# Patient Record
Sex: Male | Born: 1995 | Race: Black or African American | Hispanic: No | Marital: Single | Smoking: Never smoker
Health system: Southern US, Community
[De-identification: ages and names within clinical notes are randomized; demographics above are authoritative.]

## PROBLEM LIST (undated history)

## (undated) DIAGNOSIS — J45909 Unspecified asthma, uncomplicated: Secondary | ICD-10-CM

## (undated) DIAGNOSIS — E669 Obesity, unspecified: Secondary | ICD-10-CM

---
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
## 2015-03-08 ENCOUNTER — Emergency Department
Admission: EM | Admit: 2015-03-08 | Discharge: 2015-03-09 | Disposition: A | Discharge status: Home / Self Care | Payer: Self-pay | Attending: Emergency Medicine | Admitting: Emergency Medicine

## 2015-03-08 ENCOUNTER — Emergency Department: Payer: Self-pay

## 2015-03-08 ENCOUNTER — Other Ambulatory Visit: Payer: Self-pay

## 2015-03-08 DIAGNOSIS — J40 Bronchitis, not specified as acute or chronic: Secondary | ICD-10-CM | POA: Insufficient documentation

## 2015-03-08 MED ORDER — ALBUTEROL SULFATE (2.5 MG/3ML) 0.083% IN NEBU
2.5000 mg | INHALATION_SOLUTION | Freq: Once | RESPIRATORY_TRACT | Status: AC
  Administered 2015-03-08: 2.5 mg via RESPIRATORY_TRACT

## 2015-03-08 MED ORDER — ALBUTEROL SULFATE (2.5 MG/3ML) 0.083% IN NEBU
INHALATION_SOLUTION | RESPIRATORY_TRACT | Status: AC
  Administered 2015-03-08: 2.5 mg via RESPIRATORY_TRACT
  Filled 2015-03-08: qty 3

## 2015-03-08 NOTE — ED Notes (Signed)
Pt presents to ER stating cough congestion, pt has slight audible wheezes noted, denies hx of asthma.

## 2015-03-08 NOTE — ED Notes (Signed)
Pt reports he feels improved after breathing txt.

## 2015-03-09 LAB — CBC
HEMATOCRIT: 41.8 % (ref 40.0–52.0)
Hemoglobin: 13.8 g/dL (ref 13.0–18.0)
MCH: 29.4 pg (ref 26.0–34.0)
MCHC: 32.9 g/dL (ref 32.0–36.0)
MCV: 89.3 fL (ref 80.0–100.0)
Platelets: 291 10*3/uL (ref 150–440)
RBC: 4.69 MIL/uL (ref 4.40–5.90)
RDW: 13.8 % (ref 11.5–14.5)
WBC: 13.2 10*3/uL — ABNORMAL HIGH (ref 3.8–10.6)

## 2015-03-09 LAB — BASIC METABOLIC PANEL
Anion gap: 7 (ref 5–15)
BUN: 9 mg/dL (ref 6–20)
CO2: 28 mmol/L (ref 22–32)
CREATININE: 0.86 mg/dL (ref 0.61–1.24)
Calcium: 9.2 mg/dL (ref 8.9–10.3)
Chloride: 105 mmol/L (ref 101–111)
GFR calc Af Amer: >60 mL/min (ref >60)
GFR calc non Af Amer: >60 mL/min (ref >60)
Glucose, Bld: 124 mg/dL — ABNORMAL HIGH (ref 65–99)
Potassium: 3.5 mmol/L (ref 3.5–5.1)
Sodium: 140 mmol/L (ref 135–145)

## 2015-03-09 LAB — TROPONIN I

## 2015-03-09 MED ORDER — PREDNISONE 20 MG PO TABS
60.0000 mg | ORAL_TABLET | Freq: Every day | ORAL | Status: AC
Start: 2015-03-09 — End: 2016-03-11

## 2015-03-09 MED ORDER — PREDNISONE 20 MG PO TABS
ORAL_TABLET | ORAL | Status: AC
  Filled 2015-03-09: qty 3

## 2015-03-09 MED ORDER — PREDNISONE 20 MG PO TABS
60.0000 mg | ORAL_TABLET | Freq: Once | ORAL | Status: AC
  Administered 2015-03-09: 60 mg via ORAL

## 2015-03-09 MED ORDER — AZITHROMYCIN 250 MG PO TABS: ORAL_TABLET | ORAL | Status: AC

## 2015-03-09 MED ORDER — IPRATROPIUM-ALBUTEROL 0.5-2.5 (3) MG/3ML IN SOLN
3.0000 mL | Freq: Once | RESPIRATORY_TRACT | Status: AC
  Administered 2015-03-09: 3 mL via RESPIRATORY_TRACT

## 2015-03-09 MED ORDER — IPRATROPIUM-ALBUTEROL 0.5-2.5 (3) MG/3ML IN SOLN
RESPIRATORY_TRACT | Status: AC
  Filled 2015-03-09: qty 3

## 2015-03-09 MED ORDER — ALBUTEROL SULFATE HFA 108 (90 BASE) MCG/ACT IN AERS: 2.0000 | INHALATION_SPRAY | Freq: Four times a day (QID) | RESPIRATORY_TRACT | Status: DC | PRN

## 2015-03-09 NOTE — ED Provider Notes (Signed)
[facility] Emergency Department Provider Note  ____________________________________________  Time seen: Approximately 0002 AM  I have reviewed the triage vital signs and the nursing notes.   HISTORY  Chief Complaint Wheezing    HPI Edward Jennings is a 19 y.o. male who comes in tonight with some chest pain and shortness of breath that started at approximately 1 AM yesterday. The patient reports that he feels that his chest is tight when he breathes in and out. The patient reports that he also has had a mild cough. He has not taken anything at home for the chest pain or the cough. He reports that the pain does not radiate but he has felt dizzy with coughing and has a mild headache. The patient denies any nausea or vomiting no abdominal pain. He has never had this happen in the past and has had no sick contacts. The patient also denies fevers. The patient is concerned because he feels as though he is wheezing became in for evaluation. The patient did receive an albuterol neb treatment prior to my initial evaluation and he reports that his chest tightness is improved.   History reviewed. No pertinent past medical history.  There are no active problems to display for this patient.   History reviewed. No pertinent past surgical history.  Current Outpatient Rx  Name  Route  Sig  Dispense  Refill  . albuterol (PROVENTIL HFA;VENTOLIN HFA) 108 (90 BASE) MCG/ACT inhaler   Inhalation   Inhale 2 puffs into the lungs every 6 (six) hours as needed for wheezing or shortness of breath.   1 Inhaler   0   . azithromycin (ZITHROMAX Z-PAK) 250 MG tablet      Take 2 tablets (500 mg) on  Day 1,  followed by 1 tablet (250 mg) once daily on Days 2 through 5.   6 each   0   . predniSONE (DELTASONE) 20 MG tablet   Oral   Take 3 tablets (60 mg total) by mouth daily.   12 tablet   0     Allergies Review of patient's allergies indicates no known allergies.  History  reviewed. No pertinent family history.  Social History History  Substance Use Topics  . Smoking status: Never Smoker   . Smokeless tobacco: Not on file  . Alcohol Use: No    Review of Systems Constitutional: No fever/chills Eyes: No visual changes. ENT: No sore throat. Cardiovascular:  chest pain. Respiratory:  shortness of breath. Gastrointestinal: No abdominal pain.  No nausea, no vomiting.   Genitourinary: Negative for dysuria. Musculoskeletal: Negative for back pain. Skin: Negative for rash. Neurological: Headache  10-point ROS otherwise negative.  ____________________________________________   PHYSICAL EXAM:  VITAL SIGNS: ED Triage Vitals  Enc Vitals Group     BP 03/08/15 2216 152/79 mmHg     Pulse Rate 03/08/15 2216 97     Resp 03/08/15 2216 20     Temp 03/08/15 2216 98.2 F (36.8 C)     Temp Source 03/08/15 2216 Oral     SpO2 03/08/15 2216 94 %     Weight 03/08/15 2216 285 lb (129.275 kg)     Height 03/08/15 2216 5\' 9"  (1.753 m)     Head Cir --      Peak Flow --      Pain Score 03/08/15 2217 7     Pain Loc --      Pain Edu? --      Excl. in GC? --  Constitutional: Alert and oriented. Well appearing and in no acute distress. Eyes: Conjunctivae are normal. PERRL. EOMI. Head: Atraumatic. Nose: No congestion/rhinnorhea. Mouth/Throat: Mucous membranes are moist.  Oropharynx non-erythematous. Cardiovascular: Normal rate, regular rhythm. Grossly normal heart sounds.  Good peripheral circulation. Respiratory: Normal respiratory effort. Mild scattered expiratory wheezes throughout all lung fields Gastrointestinal: Soft and nontender. No distention. Positive bowel sounds. Genitourinary: Deferred Musculoskeletal: No lower extremity tenderness nor edema.   Neurologic:  Normal speech and language. No gross focal neurologic deficits are appreciated. Speech is normal. No gait instability. Skin:  Skin is warm, dry and intact. No rash noted. Psychiatric: Mood and  affect are normal. Speech and behavior are normal.  ____________________________________________   LABS (all labs ordered are listed, but only abnormal results are displayed)  Labs Reviewed  CBC - Abnormal; Notable for the following:    WBC 13.2 (*)    All other components within normal limits  BASIC METABOLIC PANEL - Abnormal; Notable for the following:    Glucose, Bld 124 (*)    All other components within normal limits  TROPONIN I   ____________________________________________  EKG  ED ECG REPORT   Date: 03/09/2015  EKG Time: 2229  Rate: 94  Rhythm: normal EKG, normal sinus rhythm  Axis: Normal  Intervals:none  ST&T Change: None  ____________________________________________  RADIOLOGY  Chest x-ray: No active cardiopulmonary disease ____________________________________________   PROCEDURES  Procedure(s) performed: None  Critical Care performed: No  ____________________________________________   INITIAL IMPRESSION / ASSESSMENT AND PLAN / ED COURSE  Pertinent labs & imaging results that were available during my care of the patient were reviewed by me and considered in my medical decision making (see chart for details).  The patient is 19 year old male who comes in with some shortness of breath cough chest pain and wheezing since yesterday. The patient reports that he is concerned he may have bronchitis and with this wheezing and no history of asthma it is a possibility. I will do one set of blood work to evaluate the patient's heart enzymes as well as his white blood cell count. I will give the patient a DuoNeb and a dose of prednisone and reassess the patient once I have received the results of the blood work.  The patient's blood work is unremarkable aside from a mildly elevated white blood cell count I will discharge the patient home with some prednisone and azithromycin and an inhaler for his  bronchitis. ____________________________________________   FINAL CLINICAL IMPRESSION(S) / ED DIAGNOSES  Final diagnoses:  Bronchitis      Rebecka ApleyAllison P Khamille Beynon, MD 03/09/15 [id number]

## 2015-03-09 NOTE — Discharge Instructions (Signed)
Upper Respiratory Infection, Adult An upper respiratory infection (URI) is also sometimes known as the common cold. The upper respiratory tract includes the nose, sinuses, throat, trachea, and bronchi. Bronchi are the airways leading to the lungs. Most people improve within 1 week, but symptoms can last up to 2 weeks. A residual cough may last even longer.  CAUSES Many different viruses can infect the tissues lining the upper respiratory tract. The tissues become irritated and inflamed and often become very moist. Mucus production is also common. A cold is contagious. You can easily spread the virus to others by oral contact. This includes kissing, sharing a glass, coughing, or sneezing. Touching your mouth or nose and then touching a surface, which is then touched by another person, can also spread the virus. SYMPTOMS  Symptoms typically develop 1 to 3 days after you come in contact with a cold virus. Symptoms vary from person to person. They may include:  Runny nose.  Sneezing.  Nasal congestion.  Sinus irritation.  Sore throat.  Loss of voice (laryngitis).  Cough.  Fatigue.  Muscle aches.  Loss of appetite.  Headache.  Low-grade fever. DIAGNOSIS  You might diagnose your own cold based on familiar symptoms, since most people get a cold 2 to 3 times a year. Your caregiver can confirm this based on your exam. Most importantly, your caregiver can check that your symptoms are not due to another disease such as strep throat, sinusitis, pneumonia, asthma, or epiglottitis. Blood tests, throat tests, and X-rays are not necessary to diagnose a common cold, but they may sometimes be helpful in excluding other more serious diseases. Your caregiver will decide if any further tests are required. RISKS AND COMPLICATIONS  You may be at risk for a more severe case of the common cold if you smoke cigarettes, have chronic heart disease (such as heart failure) or lung disease (such as asthma), or if  you have a weakened immune system. The very young and very old are also at risk for more serious infections. Bacterial sinusitis, middle ear infections, and bacterial pneumonia can complicate the common cold. The common cold can worsen asthma and chronic obstructive pulmonary disease (COPD). Sometimes, these complications can require emergency medical care and may be life-threatening. PREVENTION  The best way to protect against getting a cold is to practice good hygiene. Avoid oral or hand contact with people with cold symptoms. Wash your hands often if contact occurs. There is no clear evidence that vitamin C, vitamin E, echinacea, or exercise reduces the chance of developing a cold. However, it is always recommended to get plenty of rest and practice good nutrition. TREATMENT  Treatment is directed at relieving symptoms. There is no cure. Antibiotics are not effective, because the infection is caused by a virus, not by bacteria. Treatment may include:  Increased fluid intake. Sports drinks offer valuable electrolytes, sugars, and fluids.  Breathing heated mist or steam (vaporizer or shower).  Eating chicken soup or other clear broths, and maintaining good nutrition.  Getting plenty of rest.  Using gargles or lozenges for comfort.  Controlling fevers with ibuprofen or acetaminophen as directed by your caregiver.  Increasing usage of your inhaler if you have asthma. Zinc gel and zinc lozenges, taken in the first 24 hours of the common cold, can shorten the duration and lessen the severity of symptoms. Pain medicines may help with fever, muscle aches, and throat pain. A variety of non-prescription medicines are available to treat congestion and runny nose. Your caregiver   can make recommendations and may suggest nasal or lung inhalers for other symptoms.  HOME CARE INSTRUCTIONS   Only take over-the-counter or prescription medicines for pain, discomfort, or fever as directed by your  caregiver.  Use a warm mist humidifier or inhale steam from a shower to increase air moisture. This may keep secretions moist and make it easier to breathe.  Drink enough water and fluids to keep your urine clear or pale yellow.  Rest as needed.  Return to work when your temperature has returned to normal or as your caregiver advises. You may need to stay home longer to avoid infecting others. You can also use a face mask and careful hand washing to prevent spread of the virus. SEEK MEDICAL CARE IF:   After the first few days, you feel you are getting worse rather than better.  You need your caregiver's advice about medicines to control symptoms.  You develop chills, worsening shortness of breath, or brown or red sputum. These may be signs of pneumonia.  You develop yellow or brown nasal discharge or pain in the face, especially when you bend forward. These may be signs of sinusitis.  You develop a fever, swollen neck glands, pain with swallowing, or white areas in the back of your throat. These may be signs of strep throat. SEEK IMMEDIATE MEDICAL CARE IF:   You have a fever.  You develop severe or persistent headache, ear pain, sinus pain, or chest pain.  You develop wheezing, a prolonged cough, cough up blood, or have a change in your usual mucus (if you have chronic lung disease).  You develop sore muscles or a stiff neck. Document Released: 04/06/2001 Document Revised: 01/03/2012 Document Reviewed: 01/16/2014 Exit[employer] Patient Information 2015 [employer]. This information is not intended to replace advice given to you by your health care provider. Make sure you discuss any questions you have with your health care provider.  

## 2016-09-17 ENCOUNTER — Encounter: Payer: Self-pay | Admitting: Medical Oncology

## 2016-09-17 ENCOUNTER — Emergency Department
Admission: EM | Admit: 2016-09-17 | Discharge: 2016-09-17 | Disposition: A | Discharge status: Home / Self Care | Payer: Self-pay | Attending: Emergency Medicine | Admitting: Emergency Medicine

## 2016-09-17 DIAGNOSIS — Z79899 Other long term (current) drug therapy: Secondary | ICD-10-CM | POA: Insufficient documentation

## 2016-09-17 DIAGNOSIS — J4521 Mild intermittent asthma with (acute) exacerbation: Secondary | ICD-10-CM | POA: Insufficient documentation

## 2016-09-17 HISTORY — DX: Unspecified asthma, uncomplicated: J45.909

## 2016-09-17 MED ORDER — METHYLPREDNISOLONE 4 MG PO TBPK: ORAL_TABLET | ORAL | 0 refills | Status: DC

## 2016-09-17 MED ORDER — IPRATROPIUM-ALBUTEROL 0.5-2.5 (3) MG/3ML IN SOLN
3.0000 mL | Freq: Once | RESPIRATORY_TRACT | Status: AC
  Administered 2016-09-17: 3 mL via RESPIRATORY_TRACT
  Filled 2016-09-17: qty 3

## 2016-09-17 MED ORDER — ALBUTEROL SULFATE HFA 108 (90 BASE) MCG/ACT IN AERS: 2.0000 | INHALATION_SPRAY | Freq: Four times a day (QID) | RESPIRATORY_TRACT | 2 refills | Status: DC | PRN

## 2016-09-17 MED ORDER — DEXAMETHASONE SODIUM PHOSPHATE 4 MG/ML IJ SOLN
10.0000 mg | Freq: Once | INTRAMUSCULAR | Status: AC
  Administered 2016-09-17: 10 mg via INTRAMUSCULAR
  Filled 2016-09-17: qty 3

## 2016-09-17 NOTE — ED Triage Notes (Signed)
Pt reports hx of asthma, has been having sob x 3 days. Does not have inhaler at home.

## 2016-09-17 NOTE — ED Provider Notes (Signed)
[facility] Emergency Department Provider Note   ____________________________________________   First MD Initiated Contact with Patient 09/17/16 1052     (approximate)  I have reviewed the triage vital signs and the nursing notes.   HISTORY  Chief Complaint Asthma    HPI Edward Jennings is a 20 y.o. male patient complaining of shortness of breath for 3 days. Patient state has history of asthma and did not have her inhaler. Patient also states there is a nonproductive cough with this complaint. Patient rates his pain as a 7/10. No palliative measures taken for this complaint. Patient believe the change in weather history is asthma.   Past Medical History:  Diagnosis Date  . Asthma     There are no active problems to display for this patient.   History reviewed. No pertinent surgical history.  Prior to Admission medications   Medication Sig Start Date End Date Taking? Authorizing Provider  albuterol (PROVENTIL HFA;VENTOLIN HFA) 108 (90 BASE) MCG/ACT inhaler Inhale 2 puffs into the lungs every 6 (six) hours as needed for wheezing or shortness of breath. 03/09/15   Rebecka ApleyAllison P Webster, MD  albuterol (PROVENTIL HFA;VENTOLIN HFA) 108 (90 Base) MCG/ACT inhaler Inhale 2 puffs into the lungs every 6 (six) hours as needed for wheezing or shortness of breath. 09/17/16   Joni Reiningonald K Smith, PA-C  methylPREDNISolone (MEDROL DOSEPAK) 4 MG TBPK tablet Take Tapered dose as directed 09/17/16   Joni Reiningonald K Smith, PA-C    Allergies Patient has no known allergies.  No family history on file.  Social History Social History  Substance Use Topics  . Smoking status: Never Smoker  . Smokeless tobacco: Not on file  . Alcohol use No    Review of Systems Constitutional: No fever/chills Eyes: No visual changes. ENT: No sore throat. Cardiovascular: Denies chest pain. Respiratory: States mild shortness of breath and wheezing. Gastrointestinal: No abdominal pain.  No nausea, no  vomiting.  No diarrhea.  No constipation. Genitourinary: Negative for dysuria. Musculoskeletal: Negative for back pain. Skin: Negative for rash. Neurological: Negative for headaches, focal weakness or numbness.    ____________________________________________   PHYSICAL EXAM:  VITAL SIGNS: ED Triage Vitals  Enc Vitals Group     BP 09/17/16 1025 (!) 150/53     Pulse Rate 09/17/16 1025 (!) 101     Resp 09/17/16 1025 20     Temp 09/17/16 1025 98.2 F (36.8 C)     Temp Source 09/17/16 1025 Oral     SpO2 09/17/16 1025 96 %     Weight 09/17/16 1020 280 lb (127 kg)     Height 09/17/16 1020 5\' 9"  (1.753 m)     Head Circumference --      Peak Flow --      Pain Score 09/17/16 1020 7     Pain Loc --      Pain Edu? --      Excl. in GC? --     Constitutional: Alert and oriented. Well appearing and in no acute distress. Eyes: Conjunctivae are normal. PERRL. EOMI. Head: Atraumatic. Nose: No congestion/rhinnorhea. Mouth/Throat: Mucous membranes are moist.  Oropharynx non-erythematous. Neck: No stridor.  No cervical spine tenderness to palpation. Hematological/Lymphatic/Immunilogical: No cervical lymphadenopathy. Cardiovascular: Normal rate, regular rhythm. Grossly normal heart sounds.  Good peripheral circulation.Elevated systolic BP Respiratory: Normal respiratory effort.  No retractions. Lungs Bilateral expiratory wheezing. Gastrointestinal: Soft and nontender. No distention. No abdominal bruits. No CVA tenderness. Musculoskeletal: No lower extremity tenderness nor edema.  No  joint effusions. Neurologic:  Normal speech and language. No gross focal neurologic deficits are appreciated. No gait instability. Skin:  Skin is warm, dry and intact. No rash noted. Psychiatric: Mood and affect are normal. Speech and behavior are normal.  ____________________________________________   LABS (all labs ordered are listed, but only abnormal results are displayed)  Labs Reviewed - No data to  display ____________________________________________  EKG   ____________________________________________  RADIOLOGY   ____________________________________________   PROCEDURES  Procedure(s) performed: None  Procedures  Critical Care performed: No  ____________________________________________   INITIAL IMPRESSION / ASSESSMENT AND PLAN / ED COURSE  Pertinent labs & imaging results that were available during my care of the patient were reviewed by me and considered in my medical decision making (see chart for details).  Asthma exacerbation. Patient given discharge care instructions. Patient given a prescription for albuterol and Medrol Dosepak. Patient advised follow "clinic for continued care.  Clinical Course      ____________________________________________   FINAL CLINICAL IMPRESSION(S) / ED DIAGNOSES  Final diagnoses:  Mild intermittent asthma with exacerbation      NEW MEDICATIONS STARTED DURING THIS VISIT:  New Prescriptions   ALBUTEROL (PROVENTIL HFA;VENTOLIN HFA) 108 (90 BASE) MCG/ACT INHALER    Inhale 2 puffs into the lungs every 6 (six) hours as needed for wheezing or shortness of breath.   METHYLPREDNISOLONE (MEDROL DOSEPAK) 4 MG TBPK TABLET    Take Tapered dose as directed     Note:  This document was prepared using Dragon voice recognition software and may include unintentional dictation errors.    Joni ReiningRonald K Smith, PA-C 09/17/16 1101    Jennye MoccasinBrian S Quigley, MD 09/17/16 [phone]

## 2017-05-02 ENCOUNTER — Emergency Department
Admission: EM | Admit: 2017-05-02 | Discharge: 2017-05-02 | Disposition: A | Discharge status: Home / Self Care | Payer: Self-pay | Attending: Emergency Medicine | Admitting: Emergency Medicine

## 2017-05-02 ENCOUNTER — Encounter: Payer: Self-pay | Admitting: Emergency Medicine

## 2017-05-02 DIAGNOSIS — J4521 Mild intermittent asthma with (acute) exacerbation: Secondary | ICD-10-CM | POA: Insufficient documentation

## 2017-05-02 DIAGNOSIS — Z79899 Other long term (current) drug therapy: Secondary | ICD-10-CM | POA: Insufficient documentation

## 2017-05-02 MED ORDER — METHYLPREDNISOLONE SODIUM SUCC 125 MG IJ SOLR
125.0000 mg | Freq: Once | INTRAMUSCULAR | Status: AC
  Administered 2017-05-02: 125 mg via INTRAMUSCULAR
  Filled 2017-05-02: qty 2

## 2017-05-02 MED ORDER — METHYLPREDNISOLONE 4 MG PO TBPK: ORAL_TABLET | ORAL | 0 refills | Status: DC

## 2017-05-02 MED ORDER — IPRATROPIUM-ALBUTEROL 0.5-2.5 (3) MG/3ML IN SOLN
3.0000 mL | Freq: Once | RESPIRATORY_TRACT | Status: AC
  Administered 2017-05-02: 3 mL via RESPIRATORY_TRACT
  Filled 2017-05-02: qty 3

## 2017-05-02 MED ORDER — BENZONATATE 100 MG PO CAPS: 200.0000 mg | ORAL_CAPSULE | Freq: Three times a day (TID) | ORAL | 0 refills | Status: DC | PRN

## 2017-05-02 NOTE — ED Triage Notes (Signed)
Asthma exacerbation x 3 days.  Has been using albuterol inhaler, but symptoms are not resolving.  Symptoms worse at night.

## 2017-05-02 NOTE — ED Provider Notes (Signed)
[facility] Emergency Department Provider Note   ____________________________________________   First MD Initiated Contact with Patient 05/02/17 1413     (approximate)  I have reviewed the triage vital signs and the nursing notes.   HISTORY  Chief Complaint Wheezing; Cough; and Sore Throat    HPI Edward Jennings is a 21 y.o. male patient complaining of cough, wheezing, and sore throat for 3-4 days. Patient states that is worse at night when laying down. Patient stated minor relief with inhaler. Patient denies  fever associated this complaint. No other palliative measures for complaint.   Past Medical History:  Diagnosis Date  . Asthma     There are no active problems to display for this patient.   History reviewed. No pertinent surgical history.  Prior to Admission medications   Medication Sig Start Date End Date Taking? Authorizing Provider  albuterol (PROVENTIL HFA;VENTOLIN HFA) 108 (90 BASE) MCG/ACT inhaler Inhale 2 puffs into the lungs every 6 (six) hours as needed for wheezing or shortness of breath. 03/09/15   Rebecka Apley, MD  albuterol (PROVENTIL HFA;VENTOLIN HFA) 108 (90 Base) MCG/ACT inhaler Inhale 2 puffs into the lungs every 6 (six) hours as needed for wheezing or shortness of breath. 09/17/16   Joni Reining, PA-C  benzonatate (TESSALON PERLES) 100 MG capsule Take 2 capsules (200 mg total) by mouth 3 (three) times daily as needed for cough. 05/02/17 05/02/18  Joni Reining, PA-C  methylPREDNISolone (MEDROL DOSEPAK) 4 MG TBPK tablet Take Tapered dose as directed 09/17/16   Joni Reining, PA-C  methylPREDNISolone (MEDROL DOSEPAK) 4 MG TBPK tablet Take Tapered dose as directed 05/02/17   Joni Reining, PA-C    Allergies Patient has no known allergies.  No family history on file.  Social History Social History  Substance Use Topics  . Smoking status: Never Smoker  . Smokeless tobacco: Never Used  . Alcohol use No    Review of  Systems  Constitutional: No fever/chills Eyes: No visual changes. ENT: No sore throat. Cardiovascular: Denies chest pain. Respiratory:Wheezing and coughing.  Gastrointestinal: No abdominal pain.  No nausea, no vomiting.  No diarrhea.  No constipation. Genitourinary: Negative for dysuria. Musculoskeletal: Negative for back pain. Skin: Negative for rash. Neurological: Negative for headaches, focal weakness or numbness.  ____________________________________________   PHYSICAL EXAM:  VITAL SIGNS: ED Triage Vitals  Enc Vitals Group     BP 05/02/17 1402 (!) 139/92     Pulse Rate 05/02/17 1402 (!) 101     Resp 05/02/17 1402 18     Temp 05/02/17 1402 99.1 F (37.3 C)     Temp Source 05/02/17 1402 Oral     SpO2 05/02/17 1402 94 %     Weight 05/02/17 1400 280 lb (127 kg)     Height 05/02/17 1400 5\' 9"  (1.753 m)     Head Circumference --      Peak Flow --      Pain Score --      Pain Loc --      Pain Edu? --      Excl. in GC? --     Constitutional: Alert and oriented. Well appearing and in no acute distress. Eyes: Conjunctivae are normal. PERRL. EOMI. Head: Atraumatic. Nose: No congestion/rhinnorhea. Mouth/Throat: Mucous membranes are moist.  Oropharynx non-erythematous. Neck: No stridor.  Hematological/Lymphatic/Immunilogical: No cervical lymphadenopathy. Cardiovascular: Normal rate, regular rhythm. Grossly normal heart sounds.  Good peripheral circulation. Respiratory: Normal respiratory effort.  No retractions. Lungs  Diffuse wheezing . Neurologic:  Normal speech and language. No gross focal neurologic deficits are appreciated. No gait instability. Skin:  Skin is warm, dry and intact. No rash noted. Psychiatric: Mood and affect are normal. Speech and behavior are normal.  ____________________________________________   LABS (all labs ordered are listed, but only abnormal results are displayed)  Labs Reviewed - No data to  display ____________________________________________  EKG   ____________________________________________  RADIOLOGY  No results found.  ____________________________________________   PROCEDURES  Procedure(s) performed: None  Procedures  Critical Care performed: No  ____________________________________________   INITIAL IMPRESSION / ASSESSMENT AND PLAN / ED COURSE  Pertinent labs & imaging results that were available during my care of the patient were reviewed by me and considered in my medical decision making (see chart for details).  Asthma exacerbation. Patient wheezing decreased status post one nebulized treatment. Patient given discharge care instructions and advised [facility]. Patient given a work note for today.      ____________________________________________   FINAL CLINICAL IMPRESSION(S) / ED DIAGNOSES  Final diagnoses:  Mild intermittent asthma with exacerbation      NEW MEDICATIONS STARTED DURING THIS VISIT:  New Prescriptions   BENZONATATE (TESSALON PERLES) 100 MG CAPSULE    Take 2 capsules (200 mg total) by mouth 3 (three) times daily as needed for cough.   METHYLPREDNISOLONE (MEDROL DOSEPAK) 4 MG TBPK TABLET    Take Tapered dose as directed     Note:  This document was prepared using Dragon voice recognition software and may include unintentional dictation errors.    Joni ReiningSmith, Dailee Manalang K, PA-C 05/02/17 1504    Phineas SemenGoodman, [place], MD 05/02/17 [phone]

## 2017-05-02 NOTE — ED Notes (Signed)
See triage note  States he developed cough with some wheezing about 3 days ago used his inhaler with min results  Low grade fever on arival

## 2017-05-07 ENCOUNTER — Emergency Department
Admission: EM | Admit: 2017-05-07 | Discharge: 2017-05-07 | Disposition: A | Discharge status: Home / Self Care | Payer: Self-pay | Attending: Emergency Medicine | Admitting: Emergency Medicine

## 2017-05-07 ENCOUNTER — Emergency Department: Payer: Self-pay

## 2017-05-07 DIAGNOSIS — J4541 Moderate persistent asthma with (acute) exacerbation: Secondary | ICD-10-CM | POA: Insufficient documentation

## 2017-05-07 DIAGNOSIS — J45901 Unspecified asthma with (acute) exacerbation: Secondary | ICD-10-CM

## 2017-05-07 DIAGNOSIS — J45909 Unspecified asthma, uncomplicated: Secondary | ICD-10-CM | POA: Insufficient documentation

## 2017-05-07 HISTORY — DX: Obesity, unspecified: E66.9

## 2017-05-07 MED ORDER — IPRATROPIUM-ALBUTEROL 0.5-2.5 (3) MG/3ML IN SOLN
9.0000 mL | Freq: Once | RESPIRATORY_TRACT | Status: AC
  Administered 2017-05-07: 9 mL via RESPIRATORY_TRACT
  Filled 2017-05-07: qty 12

## 2017-05-07 MED ORDER — ALBUTEROL SULFATE (2.5 MG/3ML) 0.083% IN NEBU: 2.5000 mg | INHALATION_SOLUTION | Freq: Four times a day (QID) | RESPIRATORY_TRACT | 12 refills | Status: DC | PRN

## 2017-05-07 MED ORDER — ALBUTEROL SULFATE HFA 108 (90 BASE) MCG/ACT IN AERS: INHALATION_SPRAY | RESPIRATORY_TRACT | 1 refills | Status: DC

## 2017-05-07 MED ORDER — ALBUTEROL SULFATE (2.5 MG/3ML) 0.083% IN NEBU
5.0000 mg | INHALATION_SOLUTION | Freq: Once | RESPIRATORY_TRACT | Status: AC
  Administered 2017-05-07: 5 mg via RESPIRATORY_TRACT
  Filled 2017-05-07: qty 6

## 2017-05-07 MED ORDER — PREDNISONE 20 MG PO TABS
60.0000 mg | ORAL_TABLET | ORAL | Status: AC
  Administered 2017-05-07: 60 mg via ORAL
  Filled 2017-05-07: qty 3

## 2017-05-07 MED ORDER — PREDNISONE 10 MG PO TABS: ORAL_TABLET | ORAL | 0 refills | Status: DC

## 2017-05-07 NOTE — ED Provider Notes (Signed)
[facility] Emergency Department Provider Note  ____________________________________________   First MD Initiated Contact with Patient 05/07/17 [phone]     (approximate)  I have reviewed the triage vital signs and the nursing notes.   HISTORY  Chief Complaint Shortness of Breath    HPI Edward Jennings is a 21 y.o. male with a history of asthma which is developed over the last few years who presents for evaluation of gradually worsening shortness of breath and wheezing over the last week.  He states he thinks it is related to the weather.  He has a nebulizer machine at home but he has no albuterol for the machine and does not currently have an inhaler.  He has not been on steroids recently.  He denies fever/chills, chest pain, regular, vomiting, abdominal pain, dysuria.  He has had a dry cough.  Exertion makes his symptoms worse, as does hot weather, nothing particular makes it better.  He reports that he does not have any seasonal or environmental allergies.  He has some mild eczema on his neck.  He does have family members that have both eczema and asthma.  He does not have a primary care doctor.   Past Medical History:  Diagnosis Date  . Asthma   . Obesity     There are no active problems to display for this patient.   History reviewed. No pertinent surgical history.  Prior to Admission medications   Medication Sig Start Date End Date Taking? Authorizing Provider  benzonatate (TESSALON PERLES) 100 MG capsule Take 2 capsules (200 mg total) by mouth 3 (three) times daily as needed for cough. 05/02/17 05/02/18 Yes Joni Reining, PA-C  albuterol (PROVENTIL HFA;VENTOLIN HFA) 108 (90 Base) MCG/ACT inhaler Inhale 2-4 puffs by mouth every 4 hours as needed for wheezing, cough, and/or shortness of breath 05/07/17   Loleta Rose, MD  albuterol (PROVENTIL) (2.5 MG/3ML) 0.083% nebulizer solution Take 3 mLs (2.5 mg total) by nebulization every 6 (six) hours as needed for  wheezing or shortness of breath. 05/07/17   Loleta Rose, MD  predniSONE (DELTASONE) 10 MG tablet Take 6 tabs (60 mg) PO x 3 days, then take 4 tabs (40 mg) PO x 3 days, then take 2 tabs (20 mg) PO x 3 days, then take 1 tab (10 mg) PO x 3 days, then take 1/2 tab (5 mg) PO x 4 days. 05/07/17   Loleta Rose, MD    Allergies Patient has no known allergies.  History reviewed. No pertinent family history.  Social History Social History  Substance Use Topics  . Smoking status: Never Smoker  . Smokeless tobacco: Never Used  . Alcohol use No    Review of Systems Constitutional: No fever/chills Eyes: No visual changes. ENT: No sore throat. Cardiovascular: Denies chest pain. Respiratory: Gradually worsening shortness of breath and wheezing over the last week with dry cough Gastrointestinal: No abdominal pain.  No nausea, no vomiting.  No diarrhea.  No constipation. Musculoskeletal: Negative for neck pain.  Negative for back pain. Integumentary: Negative for rash. Neurological: Negative for headaches, focal weakness or numbness.   ____________________________________________   PHYSICAL EXAM:  VITAL SIGNS: ED Triage Vitals  Enc Vitals Group     BP 05/07/17 0453 (!) 135/106     Pulse Rate 05/07/17 0453 (!) 104     Resp 05/07/17 0453 20     Temp 05/07/17 0453 98.6 F (37 C)     Temp src --  SpO2 05/07/17 0453 98 %     Weight 05/07/17 0450 127 kg (280 lb)     Height 05/07/17 0450 1.753 m (5\' 9" )     Head Circumference --      Peak Flow --      Pain Score 05/07/17 0449 6     Pain Loc --      Pain Edu? --      Excl. in GC? --     Constitutional: Alert and oriented. Generally well appearing but in moderate respiratory distress Eyes: Conjunctivae are normal.  Head: Atraumatic. Nose: No congestion/rhinnorhea. Mouth/Throat: Mucous membranes are moist. Neck: No stridor.  No meningeal signs.   Cardiovascular: Mild tachycardia, regular rhythm. Good peripheral circulation.  Grossly normal heart sounds. Respiratory: Mildly increased respiratory effort.  No retractions. Moderate to severe expiratory wheezing throughout lung fields Gastrointestinal: Soft and nontender. No distention.  Musculoskeletal: No lower extremity tenderness nor edema. No gross deformities of extremities. Neurologic:  Normal speech and language. No gross focal neurologic deficits are appreciated.  Skin:  Skin is warm, dry and intact. No rash noted. Psychiatric: Mood and affect are normal. Speech and behavior are normal.  ____________________________________________   LABS (all labs ordered are listed, but only abnormal results are displayed)  Labs Reviewed - No data to display ____________________________________________  EKG  ED ECG REPORT I, Gayle Collard, the attending physician, personally viewed and interpreted this ECG.  Date: 05/07/2017 EKG Time: 4:52 AM Rate: 107 Rhythm: Sinus tachycardia QRS Axis: normal Intervals: normal ST/T Wave abnormalities: normal Narrative Interpretation: unremarkable  ____________________________________________  RADIOLOGY   Dg Chest 2 View  Result Date: 05/07/2017 CLINICAL DATA:  21 year old male with acute respiratory distress. History of asthma. EXAM: CHEST  2 VIEW COMPARISON:  Chest radiograph dated 03/08/2015 FINDINGS: The heart size and mediastinal contours are within normal limits. Both lungs are clear. The visualized skeletal structures are unremarkable. IMPRESSION: No active cardiopulmonary disease. Electronically Signed   By: Elgie CollardArash  Radparvar M.D.   On: 05/07/2017 05:05    ____________________________________________   PROCEDURES  Critical Care performed: No   Procedure(s) performed:   Procedures   ____________________________________________   INITIAL IMPRESSION / ASSESSMENT AND PLAN / ED COURSE  Pertinent labs & imaging results that were available during my care of the patient were reviewed by me and considered in  my medical decision making (see chart for details).  The patient is in moderate distress upon arrival but he is mentating well and has a normal SPO2.  I suspect that his asthma will improve dramatically after breathing treatments.  I will give him 60 of prednisone by mouth and 3 duo nebs and then reassess.  He agrees with the plan.  X-ray clear.   Clinical Course as of May 07 734  Sat May 07, 2017  [id number] Patient felt much better after DuoNeb labs and prednisone.  He still had some expiratory wheezing on auscultation but he had no more audible wheezing without the stethoscope and subjectively felt much improved.  I believe he is appropriate for discharge and outpatient follow-up.  I gave him the prescriptions listed below and provided two [employer] coupons to help with the cost.  encouraged establishing a PCP.    I gave my usual and customary return precautions.     [CF]    Clinical Course User Index [CF] Loleta RoseForbach, Mirabelle Cyphers, MD    ____________________________________________  FINAL CLINICAL IMPRESSION(S) / ED DIAGNOSES  Final diagnoses:  Moderate asthma with exacerbation, unspecified whether persistent  MEDICATIONS GIVEN DURING THIS VISIT:  Medications  albuterol (PROVENTIL) (2.5 MG/3ML) 0.083% nebulizer solution 5 mg (5 mg Nebulization Given 05/07/17 0503)  ipratropium-albuterol (DUONEB) 0.5-2.5 (3) MG/3ML nebulizer solution 9 mL (9 mLs Nebulization Given 05/07/17 0535)  predniSONE (DELTASONE) tablet 60 mg (60 mg Oral Given 05/07/17 0535)     NEW OUTPATIENT MEDICATIONS STARTED DURING THIS VISIT:  Discharge Medication List as of 05/07/2017  7:02 AM    START taking these medications   Details  albuterol (PROVENTIL) (2.5 MG/3ML) 0.083% nebulizer solution Take 3 mLs (2.5 mg total) by nebulization every 6 (six) hours as needed for wheezing or shortness of breath., Starting Sat 05/07/2017, Print    predniSONE (DELTASONE) 10 MG tablet Take 6 tabs (60 mg) PO x 3 days, then take 4 tabs (40  mg) PO x 3 days, then take 2 tabs (20 mg) PO x 3 days, then take 1 tab (10 mg) PO x 3 days, then take 1/2 tab (5 mg) PO x 4 days., Print        Discharge Medication List as of 05/07/2017  7:02 AM    CONTINUE these medications which have CHANGED   Details  albuterol (PROVENTIL HFA;VENTOLIN HFA) 108 (90 Base) MCG/ACT inhaler Inhale 2-4 puffs by mouth every 4 hours as needed for wheezing, cough, and/or shortness of breath, Print        Discharge Medication List as of 05/07/2017  7:02 AM       Note:  This document was prepared using Dragon voice recognition software and may include unintentional dictation errors.    Loleta Rose, MD 05/07/17 [phone]

## 2017-05-07 NOTE — Discharge Instructions (Signed)
We believe that your symptoms are caused today by an exacerbation of your asthma.  Please take the prescribed medications and any medications that you have at home.  Please call the number provided to establish a primary care doctor.  If you develop any new or worsening symptoms, including but not limited to fever, persistent vomiting, worsening shortness of breath, or other symptoms that concern you, please return to the Emergency Department immediately.

## 2017-06-07 ENCOUNTER — Encounter: Payer: Self-pay | Admitting: Emergency Medicine

## 2017-06-07 ENCOUNTER — Emergency Department
Admission: EM | Admit: 2017-06-07 | Discharge: 2017-06-07 | Disposition: A | Discharge status: Home / Self Care | Payer: Self-pay | Attending: Emergency Medicine | Admitting: Emergency Medicine

## 2017-06-07 DIAGNOSIS — J45901 Unspecified asthma with (acute) exacerbation: Secondary | ICD-10-CM | POA: Insufficient documentation

## 2017-06-07 LAB — GLUCOSE, CAPILLARY: Glucose-Capillary: 162 mg/dL — ABNORMAL HIGH (ref 65–99)

## 2017-06-07 MED ORDER — IPRATROPIUM-ALBUTEROL 0.5-2.5 (3) MG/3ML IN SOLN
RESPIRATORY_TRACT | Status: AC
Start: 2017-06-07 — End: 2017-06-07
  Administered 2017-06-07: 9 mL
  Filled 2017-06-07: qty 9

## 2017-06-07 MED ORDER — PREDNISONE 20 MG PO TABS: 60.0000 mg | ORAL_TABLET | Freq: Every day | ORAL | 0 refills | Status: AC

## 2017-06-07 MED ORDER — METHYLPREDNISOLONE SODIUM SUCC 125 MG IJ SOLR
125.0000 mg | Freq: Once | INTRAMUSCULAR | Status: AC
  Administered 2017-06-07: 125 mg via INTRAVENOUS
  Filled 2017-06-07: qty 2

## 2017-06-07 MED ORDER — ALBUTEROL SULFATE HFA 108 (90 BASE) MCG/ACT IN AERS: 2.0000 | INHALATION_SPRAY | Freq: Four times a day (QID) | RESPIRATORY_TRACT | 0 refills | Status: DC | PRN

## 2017-06-07 NOTE — ED Notes (Signed)

## 2017-06-07 NOTE — ED Provider Notes (Signed)
[facility] Emergency Department Provider Note    First MD Initiated Contact with Patient 06/07/17 0100     (approximate)  I have reviewed the triage vital signs and the nursing notes.   HISTORY  Chief Complaint Asthma    HPI Edward Jennings is a 21 y.o. male with history of asthma presents  to the emergency department with "asthma attack". Patient states symptoms of been occurring intermittently for the past 3 days unrelieved with home albuterol inhaler. Patient states this episode started at 10:00 PM last night and has progressively worsened. Patient denies any fever.   Past Medical History:  Diagnosis Date  . Asthma   . Obesity     There are no active problems to display for this patient.   History reviewed. No pertinent surgical history.  Prior to Admission medications   Medication Sig Start Date End Date Taking? Authorizing Provider  albuterol (PROVENTIL HFA;VENTOLIN HFA) 108 (90 Base) MCG/ACT inhaler Inhale 2-4 puffs by mouth every 4 hours as needed for wheezing, cough, and/or shortness of breath 05/07/17  Yes Loleta Rose, MD  albuterol (PROVENTIL) (2.5 MG/3ML) 0.083% nebulizer solution Take 3 mLs (2.5 mg total) by nebulization every 6 (six) hours as needed for wheezing or shortness of breath. 05/07/17  Yes Loleta Rose, MD  benzonatate (TESSALON PERLES) 100 MG capsule Take 2 capsules (200 mg total) by mouth 3 (three) times daily as needed for cough. Patient not taking: Reported on 06/07/2017 05/02/17 05/02/18  Joni Reining, PA-C  predniSONE (DELTASONE) 10 MG tablet Take 6 tabs (60 mg) PO x 3 days, then take 4 tabs (40 mg) PO x 3 days, then take 2 tabs (20 mg) PO x 3 days, then take 1 tab (10 mg) PO x 3 days, then take 1/2 tab (5 mg) PO x 4 days. Patient not taking: Reported on 06/07/2017 05/07/17   Loleta Rose, MD    Allergies No known drug allergies No family history on file.  Social History Social History  Substance Use Topics  . Smoking  status: Never Smoker  . Smokeless tobacco: Never Used  . Alcohol use Yes    Review of Systems Constitutional: No fever/chills Eyes: No visual changes. ENT: No sore throat. Cardiovascular: Denies chest pain. Respiratory:Positive for dyspnea and wheezing Gastrointestinal: No abdominal pain.  No nausea, no vomiting.  No diarrhea.  No constipation. Genitourinary: Negative for dysuria. Musculoskeletal: Negative for neck pain.  Negative for back pain. Integumentary: Negative for rash. Neurological: Negative for headaches, focal weakness or numbness.  ____________________________________________   PHYSICAL EXAM:  VITAL SIGNS: ED Triage Vitals  Enc Vitals Group     BP 06/07/17 0103 140/87     Pulse Rate 06/07/17 0103 99     Resp 06/07/17 0103 (!) 28     Temp 06/07/17 0103 98.4 F (36.9 C)     Temp Source 06/07/17 0103 Oral     SpO2 06/07/17 0103 92 %     Weight 06/07/17 0103 127 kg (280 lb)     Height 06/07/17 0103 1.727 m (5\' 8" )     Head Circumference --      Peak Flow --      Pain Score 06/07/17 0102 7     Pain Loc --      Pain Edu? --      Excl. in GC? --     Constitutional: Alert and oriented. Apparent respiratory distress Eyes: Conjunctivae are normal.  Head: Atraumatic.[name] Mouth/Throat: Mucous membranes are moist Neck:  No stridor.  Cardiovascular: Normal rate, regular rhythm. Good peripheral circulation. Grossly normal heart sounds. Respiratory: Tachypnea, diffuse extremity wheezing Gastrointestinal: Soft and nontender. No distention.  Musculoskeletal: No lower extremity tenderness nor edema. No gross deformities of extremities. Neurologic:  Normal speech and language. No gross focal neurologic deficits are appreciated.  Skin:  Skin is warm, dry and intact. No rash noted. Psychiatric: Mood and affect are normal. Speech and behavior are normal.  ____________________________________________   LABS (all labs ordered are listed, but only abnormal results are  displayed)  Labs Reviewed  GLUCOSE, CAPILLARY - Abnormal; Notable for the following:       Result Value   Glucose-Capillary 162 (*)    All other components within normal limits     PROCEDURES  Critical Care performed: CRITICAL CARE Performed by: Darci CurrentANDOLPH N BROWN   Total critical care time: 30 minutes  Critical care time was exclusive of separately billable procedures and treating other patients.  Critical care was necessary to treat or prevent imminent or life-threatening deterioration.  Critical care was time spent personally by me on the following activities: development of treatment plan with patient and/or surrogate as well as nursing, discussions with consultants, evaluation of patient's response to treatment, examination of patient, obtaining history from patient or surrogate, ordering and performing treatments and interventions, ordering and review of laboratory studies, ordering and review of radiographic studies, pulse oximetry and re-evaluation of patient's condition.    Procedures   ____________________________________________   INITIAL IMPRESSION / ASSESSMENT AND PLAN / ED COURSE  Pertinent labs & imaging results that were available during my care of the patient were reviewed by me and considered in my medical decision making (see chart for details).  21 year old male presenting with history of physical exam consistent with acute asthma exacerbation. Patient given DuoNeb 3 as well as Solu-Medrol 125 mg IV with resolution of symptoms. Patient will be prescribed prednisone for home as well as albuterol inhaler.      ____________________________________________  FINAL CLINICAL IMPRESSION(S) / ED DIAGNOSES  Final diagnoses:  Moderate asthma with exacerbation, unspecified whether persistent     MEDICATIONS GIVEN DURING THIS VISIT:  Medications  methylPREDNISolone sodium succinate (SOLU-MEDROL) 125 mg/2 mL injection 125 mg (125 mg Intravenous Given 06/07/17  0113)  ipratropium-albuterol (DUONEB) 0.5-2.5 (3) MG/3ML nebulizer solution (9 mLs  Given 06/07/17 0113)     NEW OUTPATIENT MEDICATIONS STARTED DURING THIS VISIT:  New Prescriptions   No medications on file    Modified Medications   No medications on file    Discontinued Medications   No medications on file     Note:  This document was prepared using Dragon voice recognition software and may include unintentional dictation errors.    Darci CurrentBrown, [place] N, MD 06/07/17 [phone]

## 2017-06-07 NOTE — ED Triage Notes (Signed)
Pt presents to ED with c/o asthma attack. Hx of the same. Pt states he ran out of his inhaler about 3 days ago. Started wheezing around 2200 last night has gotten progressively worse. Audible wheezing with increased work of breathing  noted at this time.

## 2017-06-29 ENCOUNTER — Encounter: Payer: Self-pay | Admitting: Emergency Medicine

## 2017-06-29 ENCOUNTER — Emergency Department
Admission: EM | Admit: 2017-06-29 | Discharge: 2017-06-29 | Disposition: A | Discharge status: Home / Self Care | Payer: Self-pay | Attending: Emergency Medicine | Admitting: Emergency Medicine

## 2017-06-29 DIAGNOSIS — J45901 Unspecified asthma with (acute) exacerbation: Secondary | ICD-10-CM | POA: Insufficient documentation

## 2017-06-29 MED ORDER — COMPRESSOR/NEBULIZER MISC: 1.0000 [IU] | Freq: Once | 0 refills | Status: AC

## 2017-06-29 MED ORDER — PREDNISONE 20 MG PO TABS: 60.0000 mg | ORAL_TABLET | Freq: Every day | ORAL | 0 refills | Status: AC

## 2017-06-29 MED ORDER — IPRATROPIUM-ALBUTEROL 0.5-2.5 (3) MG/3ML IN SOLN
6.0000 mL | Freq: Four times a day (QID) | RESPIRATORY_TRACT | Status: DC
  Administered 2017-06-29: 6 mL via RESPIRATORY_TRACT

## 2017-06-29 MED ORDER — ALBUTEROL SULFATE (2.5 MG/3ML) 0.083% IN NEBU
5.0000 mg | INHALATION_SOLUTION | Freq: Once | RESPIRATORY_TRACT | Status: AC
  Administered 2017-06-29: 5 mg via RESPIRATORY_TRACT

## 2017-06-29 MED ORDER — ALBUTEROL SULFATE (2.5 MG/3ML) 0.083% IN NEBU: 2.5000 mg | INHALATION_SOLUTION | RESPIRATORY_TRACT | 0 refills | Status: DC | PRN

## 2017-06-29 MED ORDER — METHYLPREDNISOLONE SODIUM SUCC 125 MG IJ SOLR
125.0000 mg | Freq: Once | INTRAMUSCULAR | Status: AC
  Administered 2017-06-29: 125 mg via INTRAVENOUS

## 2017-06-29 MED ORDER — METHYLPREDNISOLONE SODIUM SUCC 125 MG IJ SOLR
INTRAMUSCULAR | Status: AC
  Administered 2017-06-29: 125 mg via INTRAVENOUS
  Filled 2017-06-29: qty 2

## 2017-06-29 MED ORDER — ALBUTEROL SULFATE (2.5 MG/3ML) 0.083% IN NEBU
INHALATION_SOLUTION | RESPIRATORY_TRACT | Status: AC
  Administered 2017-06-29: 5 mg via RESPIRATORY_TRACT
  Filled 2017-06-29: qty 6

## 2017-06-29 NOTE — ED Triage Notes (Signed)
Patient to ER with c/o shortness of breath. Patient unable to speak upon arrival, has to hold hands above head to be able to breathe, and sats at 89% on RA. Patient with labored breathing with accessory muscle use. Patient given Albuterol in triage and moved to room 16 for triage afterwards.

## 2017-06-29 NOTE — ED Provider Notes (Signed)
[facility] Emergency Department Provider Note   First MD Initiated Contact with Patient 06/29/17 0025     (approximate)  I have reviewed the triage vital signs and the nursing notes.   HISTORY  Chief Complaint Shortness of Breath   HPI Edward Jennings is a 21 y.o. male with history of asthma presents to the emergency department with asthma attack times one day. Patient states progressive wheezing and shortness of breath. Patient denies any fever afebrile on presentation. Patient states that he does not have any asthma treatment at home at this time stating that his nebulizer machine broke. In addition the patient states he has a prescription for an albuterol inhaler but he thought he would be able to wait until tomorrow to fill it.   Past Medical History:  Diagnosis Date  . Asthma   . Obesity     There are no active problems to display for this patient.   History reviewed. No pertinent surgical history.  Prior to Admission medications   Medication Sig Start Date End Date Taking? Authorizing Provider  albuterol (PROVENTIL HFA;VENTOLIN HFA) 108 (90 Base) MCG/ACT inhaler Inhale 2-4 puffs by mouth every 4 hours as needed for wheezing, cough, and/or shortness of breath 05/07/17   Loleta RoseForbach, Cory, MD  albuterol (PROVENTIL HFA;VENTOLIN HFA) 108 (90 Base) MCG/ACT inhaler Inhale 2 puffs into the lungs every 6 (six) hours as needed for wheezing or shortness of breath. 06/07/17   Darci CurrentBrown, [place] N, MD  albuterol (PROVENTIL) (2.5 MG/3ML) 0.083% nebulizer solution Take 3 mLs (2.5 mg total) by nebulization every 6 (six) hours as needed for wheezing or shortness of breath. 05/07/17   Loleta RoseForbach, Cory, MD  albuterol (PROVENTIL) (2.5 MG/3ML) 0.083% nebulizer solution Take 3 mLs (2.5 mg total) by nebulization every 4 (four) hours as needed for wheezing or shortness of breath. 06/29/17   Darci CurrentBrown, [place] N, MD  benzonatate (TESSALON PERLES) 100 MG capsule Take 2 capsules (200 mg total) by  mouth 3 (three) times daily as needed for cough. Patient not taking: Reported on 06/07/2017 05/02/17 05/02/18  Joni ReiningSmith, Ronald K, PA-C  Nebulizers (COMPRESSOR/NEBULIZER) MISC 1 Units by Does not apply route once. 06/29/17 06/29/17  Darci CurrentBrown, [place] N, MD  predniSONE (DELTASONE) 10 MG tablet Take 6 tabs (60 mg) PO x 3 days, then take 4 tabs (40 mg) PO x 3 days, then take 2 tabs (20 mg) PO x 3 days, then take 1 tab (10 mg) PO x 3 days, then take 1/2 tab (5 mg) PO x 4 days. Patient not taking: Reported on 06/07/2017 05/07/17   Loleta RoseForbach, Cory, MD  predniSONE (DELTASONE) 20 MG tablet Take 3 tablets (60 mg total) by mouth daily. 06/29/17 07/04/17  Darci CurrentBrown, [place] N, MD    Allergies No known drug allergies No family history on file.  Social History Social History  Substance Use Topics  . Smoking status: Never Smoker  . Smokeless tobacco: Never Used  . Alcohol use Yes    Review of Systems Constitutional: No fever/chills Eyes: No visual changes. ENT: No sore throat. Cardiovascular: Denies chest pain. Respiratory: Positive for dyspnea and wheezing Gastrointestinal: No abdominal pain.  No nausea, no vomiting.  No diarrhea.  No constipation. Genitourinary: Negative for dysuria. Musculoskeletal: Negative for neck pain.  Negative for back pain. Integumentary: Negative for rash. Neurological: Negative for headaches, focal weakness or numbness.  ____________________________________________   PHYSICAL EXAM:  VITAL SIGNS: ED Triage Vitals  Enc Vitals Group     BP 06/29/17 0025 [name](!)  171/124     Pulse Rate 06/29/17 0025 (!) 115     Resp 06/29/17 0025 (!) 28     Temp 06/29/17 0025 98.4 F (36.9 C)     Temp Source 06/29/17 0025 Oral     SpO2 06/29/17 0025 (!) 89 %     Weight 06/29/17 0026 127 kg (280 lb)     Height 06/29/17 0026 1.727 m (5\' 8" )     Head Circumference --      Peak Flow --      Pain Score 06/29/17 0011 0     Pain Loc --      Pain Edu? --      Excl. in GC? --     Constitutional: Alert  and oriented. Well appearing and in no acute distress. Eyes: Conjunctivae are normal.  Head: Atraumatic. Mouth/Throat: Mucous membranes are moist.  Oropharynx non-erythematous. Neck: No stridor.   Cardiovascular: Normal rate, regular rhythm. Good peripheral circulation. Grossly normal heart sounds. Respiratory: Normal respiratory effort.  No retractions. Diffuse expiratory wheezes Gastrointestinal: Soft and nontender. No distention.  Musculoskeletal: No lower extremity tenderness nor edema. No gross deformities of extremities. Neurologic:  Normal speech and language. No gross focal neurologic deficits are appreciated.  Skin:  Skin is warm, dry and intact. No rash noted. Psychiatric: Mood and affect are normal. Speech and behavior are normal.*   Procedures   ____________________________________________   INITIAL IMPRESSION / ASSESSMENT AND PLAN / ED COURSE  Pertinent labs & imaging results that were available during my care of the patient were reviewed by me and considered in my medical decision making (see chart for details). Patient given multiple albuterol nebulized treatments in the emergency department as well as Solu-Medrol with complete resolution of wheezing. Patient will be prescribed a nebulizer machine with albuterol as well as albuterol inhaler for home. In addition patient was prescribed prednisone     ____________________________________________  FINAL CLINICAL IMPRESSION(S) / ED DIAGNOSES  Final diagnoses:  Moderate asthma with exacerbation, unspecified whether persistent     MEDICATIONS GIVEN DURING THIS VISIT:  Medications  albuterol (PROVENTIL) (2.5 MG/3ML) 0.083% nebulizer solution 5 mg (5 mg Nebulization Given 06/29/17 0017)  methylPREDNISolone sodium succinate (SOLU-MEDROL) 125 mg/2 mL injection 125 mg (125 mg Intravenous Given 06/29/17 0042)     NEW OUTPATIENT MEDICATIONS STARTED DURING THIS VISIT:  Discharge Medication List as of 06/29/2017  1:59 AM      START taking these medications   Details  !! albuterol (PROVENTIL) (2.5 MG/3ML) 0.083% nebulizer solution Take 3 mLs (2.5 mg total) by nebulization every 4 (four) hours as needed for wheezing or shortness of breath., Starting Wed 06/29/2017, Print    Nebulizers (COMPRESSOR/NEBULIZER) MISC 1 Units by Does not apply route once., Starting Wed 06/29/2017, Print    !! predniSONE (DELTASONE) 20 MG tablet Take 3 tablets (60 mg total) by mouth daily., Starting Wed 06/29/2017, Until Mon 07/04/2017, Print     !! - Potential duplicate medications found. Please discuss with provider.      Discharge Medication List as of 06/29/2017  1:59 AM      Discharge Medication List as of 06/29/2017  1:59 AM       Note:  This document was prepared using Dragon voice recognition software and may include unintentional dictation errors.    Darci Current, MD 06/29/17 [phone]

## 2017-07-16 ENCOUNTER — Emergency Department: Payer: Self-pay

## 2017-07-16 ENCOUNTER — Emergency Department
Admission: EM | Admit: 2017-07-16 | Discharge: 2017-07-16 | Disposition: A | Discharge status: Home / Self Care | Payer: Self-pay | Attending: Emergency Medicine | Admitting: Emergency Medicine

## 2017-07-16 ENCOUNTER — Encounter: Payer: Self-pay | Admitting: Emergency Medicine

## 2017-07-16 DIAGNOSIS — J45901 Unspecified asthma with (acute) exacerbation: Secondary | ICD-10-CM | POA: Insufficient documentation

## 2017-07-16 MED ORDER — METHYLPREDNISOLONE SODIUM SUCC 125 MG IJ SOLR
125.0000 mg | Freq: Once | INTRAMUSCULAR | Status: AC
  Administered 2017-07-16: 125 mg via INTRAMUSCULAR
  Filled 2017-07-16: qty 2

## 2017-07-16 MED ORDER — ALBUTEROL SULFATE (2.5 MG/3ML) 0.083% IN NEBU: 5.0000 mg | INHALATION_SOLUTION | Freq: Once | RESPIRATORY_TRACT | Status: DC

## 2017-07-16 MED ORDER — IPRATROPIUM-ALBUTEROL 0.5-2.5 (3) MG/3ML IN SOLN
3.0000 mL | Freq: Once | RESPIRATORY_TRACT | Status: AC
  Administered 2017-07-16: 3 mL via RESPIRATORY_TRACT

## 2017-07-16 MED ORDER — IPRATROPIUM-ALBUTEROL 0.5-2.5 (3) MG/3ML IN SOLN
RESPIRATORY_TRACT | Status: AC
  Administered 2017-07-16: 3 mL via RESPIRATORY_TRACT
  Filled 2017-07-16: qty 3

## 2017-07-16 MED ORDER — PREDNISONE 10 MG PO TABS: ORAL_TABLET | ORAL | 0 refills | Status: DC

## 2017-07-16 MED ORDER — IPRATROPIUM-ALBUTEROL 0.5-2.5 (3) MG/3ML IN SOLN
3.0000 mL | Freq: Once | RESPIRATORY_TRACT | Status: AC
  Administered 2017-07-16: 3 mL via RESPIRATORY_TRACT
  Filled 2017-07-16: qty 3

## 2017-07-16 NOTE — ED Provider Notes (Signed)
[facility] Emergency Department Provider Note  ____________________________________________  Time seen: Approximately 2:39 PM  I have reviewed the triage vital signs and the nursing notes.   HISTORY  Chief Complaint Shortness of Breath    HPI Edward Jennings is a 21 y.o. male with PMH of asthma that presents to the Emergency Department for evaluation of shortness of breath. Patient states that he feels that his asthma has flared up. He started having wheezing and difficulty breathing about 2 hours ago. He used an albuterol inhaler and nebulizer without relief. He states that he has a flareup every couple of months. His asthma worsened about 3 years ago after he had bronchitis. No recent illness. No fever, chest pain, nausea, vomiting, abdominal pain.   Past Medical History:  Diagnosis Date  . Asthma   . Obesity     There are no active problems to display for this patient.   History reviewed. No pertinent surgical history.  Prior to Admission medications   Medication Sig Start Date End Date Taking? Authorizing Provider  albuterol (PROVENTIL HFA;VENTOLIN HFA) 108 (90 Base) MCG/ACT inhaler Inhale 2-4 puffs by mouth every 4 hours as needed for wheezing, cough, and/or shortness of breath 05/07/17   Loleta Rose, MD  albuterol (PROVENTIL HFA;VENTOLIN HFA) 108 (90 Base) MCG/ACT inhaler Inhale 2 puffs into the lungs every 6 (six) hours as needed for wheezing or shortness of breath. 06/07/17   Darci Current, MD  albuterol (PROVENTIL) (2.5 MG/3ML) 0.083% nebulizer solution Take 3 mLs (2.5 mg total) by nebulization every 6 (six) hours as needed for wheezing or shortness of breath. 05/07/17   Loleta Rose, MD  albuterol (PROVENTIL) (2.5 MG/3ML) 0.083% nebulizer solution Take 3 mLs (2.5 mg total) by nebulization every 4 (four) hours as needed for wheezing or shortness of breath. 06/29/17   Darci Current, MD  benzonatate (TESSALON PERLES) 100 MG capsule Take 2 capsules  (200 mg total) by mouth 3 (three) times daily as needed for cough. Patient not taking: Reported on 06/07/2017 05/02/17 05/02/18  Joni Reining, PA-C  predniSONE (DELTASONE) 10 MG tablet Take 6 tablets on day 1, take 5 tablets on day 2, take 4 tablets on day 3, take 3 tablets on day 4, take 2 tablets on day 5, take 1 tablet on day 6 07/16/17   Enid Derry, PA-C    Allergies Patient has no known allergies.  No family history on file.  Social History Social History  Substance Use Topics  . Smoking status: Never Smoker  . Smokeless tobacco: Never Used  . Alcohol use Yes     Review of Systems  Constitutional: No fever/chills Cardiovascular: No chest pain. Gastrointestinal: No abdominal pain.  No nausea, no vomiting.  Musculoskeletal: Negative for musculoskeletal pain. Skin: Negative for rash, abrasions, lacerations, ecchymosis. Neurological: Negative for headaches, numbness or tingling   ____________________________________________   PHYSICAL EXAM:  VITAL SIGNS: ED Triage Vitals  Enc Vitals Group     BP 07/16/17 1314 121/87     Pulse Rate 07/16/17 1314 (!) 104     Resp 07/16/17 1314 (!) 24     Temp 07/16/17 1314 98.1 F (36.7 C)     Temp Source 07/16/17 1314 Oral     SpO2 07/16/17 1314 94 %     Weight 07/16/17 1316 280 lb (127 kg)     Height 07/16/17 1316  (1.727 m)     Head Circumference --      Peak Flow --  Pain Score 07/16/17 1313 8     Pain Loc --      Pain Edu? --      Excl. in GC? --      Constitutional: Alert and oriented. Well appearing and in no acute distress. Eyes: Conjunctivae are normal. PERRL. EOMI. Head: Atraumatic. ENT:      Ears:      Nose: No congestion/rhinnorhea.      Mouth/Throat: Mucous membranes are moist.  Neck: No stridor.   Cardiovascular: Normal rate, regular rhythm.  Good peripheral circulation. Respiratory: Normal respiratory effort without tachypnea or retractions. Scattered wheezes. Good air entry to the bases with no  decreased or absent breath sounds. Musculoskeletal: Full range of motion to all extremities. No gross deformities appreciated. Neurologic:  Normal speech and language. No gross focal neurologic deficits are appreciated.  Skin:  Skin is warm, dry and intact. No rash noted.   ____________________________________________   LABS (all labs ordered are listed, but only abnormal results are displayed)  Labs Reviewed - No data to display ____________________________________________  EKG   ____________________________________________  RADIOLOGY Lexine Baton, personally viewed and evaluated these images (plain radiographs) as part of my medical decision making, as well as reviewing the written report by the radiologist.  Dg Chest 2 View  Result Date: 07/16/2017 CLINICAL DATA:  Shortness breath and asthma beginning today. EXAM: CHEST  2 VIEW COMPARISON:  05/07/2017 FINDINGS: The heart size and mediastinal contours are within normal limits. Both lungs are clear. The visualized skeletal structures are unremarkable. IMPRESSION: No active cardiopulmonary disease. Electronically Signed   By: Elberta Fortis M.D.   On: 07/16/2017 14:12    ____________________________________________    PROCEDURES  Procedure(s) performed:    Procedures    Medications  ipratropium-albuterol (DUONEB) 0.5-2.5 (3) MG/3ML nebulizer solution 3 mL (3 mLs Nebulization Given 07/16/17 1324)  ipratropium-albuterol (DUONEB) 0.5-2.5 (3) MG/3ML nebulizer solution 3 mL (3 mLs Nebulization Given 07/16/17 1435)  methylPREDNISolone sodium succinate (SOLU-MEDROL) 125 mg/2 mL injection 125 mg (125 mg Intramuscular Given 07/16/17 1435)     ____________________________________________   INITIAL IMPRESSION / ASSESSMENT AND PLAN / ED COURSE  Pertinent labs & imaging results that were available during my care of the patient were reviewed by me and considered in my medical decision making (see chart for details).  Review of  the [place] CSRS was performed in accordance of the [facility] prior to dispensing any controlled drugs.   Patient's diagnosis is consistent with asthma exacerbation. Vital signs and exam are reassuring. Patient had completely relief with DuoNeb treatments. He was given a SoluMedrol injection. Patient will be discharged home with prescriptions for prednisone. Patient is to follow up with PCP as directed. Patient is given ED precautions to return to the ED for any worsening or new symptoms.     ____________________________________________  FINAL CLINICAL IMPRESSION(S) / ED DIAGNOSES  Final diagnoses:  Exacerbation of asthma, unspecified asthma severity, unspecified whether persistent      NEW MEDICATIONS STARTED DURING THIS VISIT:  Discharge Medication List as of 07/16/2017  3:07 PM          This chart was dictated using voice recognition software/Dragon. Despite best efforts to proofread, errors can occur which can change the meaning. Any change was purely unintentional.    Enid Derry, PA-C 07/16/17 1724    Governor Rooks, MD 07/17/17 [phone]

## 2017-07-16 NOTE — ED Triage Notes (Signed)
Pt reports sob/asthma that began flaring up about 2 hrs ago. Pt reports no relief with inhalers. Pt denies pain.

## 2017-07-16 NOTE — ED Notes (Signed)
Patient greatly improved after treatment, better air movement, less wheeze. Patient states relief.

## 2017-07-16 NOTE — ED Notes (Signed)
Patient states his inhaler was not working at home. Breathing treatment in progress, will assess for relief.

## 2017-07-21 ENCOUNTER — Emergency Department
Admission: EM | Admit: 2017-07-21 | Discharge: 2017-07-21 | Disposition: A | Discharge status: Home / Self Care | Payer: Self-pay | Attending: Emergency Medicine | Admitting: Emergency Medicine

## 2017-07-21 ENCOUNTER — Emergency Department: Payer: Self-pay

## 2017-07-21 DIAGNOSIS — J45909 Unspecified asthma, uncomplicated: Secondary | ICD-10-CM | POA: Insufficient documentation

## 2017-07-21 DIAGNOSIS — J45901 Unspecified asthma with (acute) exacerbation: Secondary | ICD-10-CM | POA: Insufficient documentation

## 2017-07-21 MED ORDER — IPRATROPIUM-ALBUTEROL 0.5-2.5 (3) MG/3ML IN SOLN
3.0000 mL | Freq: Once | RESPIRATORY_TRACT | Status: AC
  Administered 2017-07-21: 3 mL via RESPIRATORY_TRACT
  Filled 2017-07-21: qty 3

## 2017-07-21 MED ORDER — ALBUTEROL SULFATE HFA 108 (90 BASE) MCG/ACT IN AERS: 2.0000 | INHALATION_SPRAY | Freq: Four times a day (QID) | RESPIRATORY_TRACT | 2 refills | Status: DC | PRN

## 2017-07-21 MED ORDER — ALBUTEROL SULFATE (2.5 MG/3ML) 0.083% IN NEBU
5.0000 mg | INHALATION_SOLUTION | Freq: Once | RESPIRATORY_TRACT | Status: AC
  Administered 2017-07-21: 5 mg via RESPIRATORY_TRACT

## 2017-07-21 MED ORDER — SPACER/AERO CHAMBER MOUTHPIECE MISC: 1.0000 [IU] | 0 refills | Status: DC | PRN

## 2017-07-21 MED ORDER — PREDNISONE 20 MG PO TABS
60.0000 mg | ORAL_TABLET | Freq: Once | ORAL | Status: AC
  Administered 2017-07-21: 60 mg via ORAL
  Filled 2017-07-21: qty 3

## 2017-07-21 MED ORDER — ALBUTEROL SULFATE (2.5 MG/3ML) 0.083% IN NEBU
INHALATION_SOLUTION | RESPIRATORY_TRACT | Status: AC
  Administered 2017-07-21: 5 mg via RESPIRATORY_TRACT
  Filled 2017-07-21: qty 6

## 2017-07-21 MED ORDER — ALBUTEROL SULFATE (2.5 MG/3ML) 0.083% IN NEBU
INHALATION_SOLUTION | RESPIRATORY_TRACT | Status: AC
  Filled 2017-07-21: qty 3

## 2017-07-21 MED ORDER — ALBUTEROL SULFATE (2.5 MG/3ML) 0.083% IN NEBU
5.0000 mg | INHALATION_SOLUTION | Freq: Once | RESPIRATORY_TRACT | Status: AC
  Administered 2017-07-21: 5 mg via RESPIRATORY_TRACT
  Filled 2017-07-21: qty 6

## 2017-07-21 MED ORDER — PREDNISONE 10 MG PO TABS: 50.0000 mg | ORAL_TABLET | Freq: Every day | ORAL | 0 refills | Status: AC

## 2017-07-21 NOTE — ED Notes (Signed)

## 2017-07-21 NOTE — ED Triage Notes (Signed)
Pt presents to ED with asthma attack, states has used inhaler 20 times today without relief. Expiratory wheezes noted. Breathing treatment started. Pt states chest tightness, throat itching, coughing up yellow/green. Alert, oriented, ambulatory.

## 2017-07-21 NOTE — ED Provider Notes (Signed)
[facility] Emergency Department Provider Note  ____________________________________________   First MD Initiated Contact with Patient 07/21/17 2219     (approximate)  I have reviewed the triage vital signs and the nursing notes.   HISTORY  Chief Complaint Asthma   HPI Edward Jennings is a 21 y.o. male who self presents to the emergency department with shortness of breath that began this morning. His past medical history of asthma since age 32 and is never been intubated. He feels like his triggers are when the weather changes. He has used his albuterol inhaler at home with minimal relief. He feels moderate severity chest tightness and shortness of breath worse with exertion and somewhat improved with albuterol and with rest. He's had no fevers or chills. Some dry cough.   Past Medical History:  Diagnosis Date  . Asthma   . Obesity     There are no active problems to display for this patient.   History reviewed. No pertinent surgical history.  Prior to Admission medications   Medication Sig Start Date End Date Taking? Authorizing Provider  albuterol (PROVENTIL HFA;VENTOLIN HFA) 108 (90 Base) MCG/ACT inhaler Inhale 2 puffs into the lungs every 6 (six) hours as needed for wheezing or shortness of breath. 07/21/17   Merrily Brittle, MD  predniSONE (DELTASONE) 10 MG tablet Take 5 tablets (50 mg total) by mouth daily. 07/21/17 07/25/17  Merrily Brittle, MD  Spacer/Aero Chamber Mouthpiece MISC 1 Units by Does not apply route every 4 (four) hours as needed (wheezing). 07/21/17   Merrily Brittle, MD    Allergies Patient has no known allergies.  History reviewed. No pertinent family history.  Social History Social History  Substance Use Topics  . Smoking status: Never Smoker  . Smokeless tobacco: Never Used  . Alcohol use Yes    Review of Systems Constitutional: No fever/chills Eyes: No visual changes. ENT: No sore throat. Cardiovascular: positive for  chest pain. Respiratory: positive for shortness of breath. Gastrointestinal: No abdominal pain.  No nausea, no vomiting.  No diarrhea.  No constipation. Genitourinary: Negative for dysuria. Musculoskeletal: Negative for back pain. Skin: Negative for rash. Neurological: Negative for headaches, focal weakness or numbness.   ____________________________________________   PHYSICAL EXAM:  VITAL SIGNS: ED Triage Vitals  Enc Vitals Group     BP 07/21/17 2156 (!) 129/91     Pulse Rate 07/21/17 2156 (!) 120     Resp 07/21/17 2156 18     Temp 07/21/17 2156 98.4 F (36.9 C)     Temp Source 07/21/17 2156 Oral     SpO2 07/21/17 2156 90 %     Weight 07/21/17 2157 270 lb (122.5 kg)     Height 07/21/17 2157  (1.727 m)     Head Circumference --      Peak Flow --      Pain Score 07/21/17 2201 8     Pain Loc --      Pain Edu? --      Excl. in GC? --     Constitutional: alert and oriented 4 appears somewhat short of breath speaking in short sentences with audible wheeze Eyes: PERRL EOMI. Head: Atraumatic. Nose: No congestion/rhinnorhea. Mouth/Throat: No trismus Neck: No stridor.   Cardiovascular: tachycardicrate, regular rhythm. Grossly normal heart sounds.  Good peripheral circulation. Respiratory: increased respiratory effort with prolonged expiratory phase and diffuse wheezing throughout moving good air however Gastrointestinal: soft nontender Musculoskeletal: No lower extremity edema   Neurologic:  Normal speech and  language. No gross focal neurologic deficits are appreciated. Skin:  Skin is warm, dry and intact. No rash noted. Psychiatric: Mood and affect are normal. Speech and behavior are normal.    ____________________________________________   DIFFERENTIAL includes but not limited to  asthma exacerbation, pneumonia, pneumothorax, pulmonary embolus ____________________________________________   LABS (all labs ordered are listed, but only abnormal results are  displayed)  Labs Reviewed - No data to display   __________________________________________  EKG   ____________________________________________  RADIOLOGY  chest x-ray reviewed by me shows no acute disease ____________________________________________   PROCEDURES  Procedure(s) performed: no  Procedures  Critical Care performed: no  Observation: no ____________________________________________   INITIAL IMPRESSION / ASSESSMENT AND PLAN / ED COURSE  Pertinent labs & imaging results that were available during my care of the patient were reviewed by me and considered in my medical decision making (see chart for details).  On arrival the patient is saturating 90% and somewhat short of breath although he is moving air. He feels improved after his first breathing treatment however his saturation only came up to 93 or 94%. I will give him 3 duo nebs as well as 60 mg of prednisone and reevaluate. I do anticipate discharge home.     _____The patient feels significantly improved after his breathing treatments and is saturating 98-99% on room air. At this point he is medically stable for outpatient management verbalizes understanding and agreement with the plan._______________________________________   FINAL CLINICAL IMPRESSION(S) / ED DIAGNOSES  Final diagnoses:  Moderate asthma with exacerbation, unspecified whether persistent      NEW MEDICATIONS STARTED DURING THIS VISIT:  New Prescriptions   ALBUTEROL (PROVENTIL HFA;VENTOLIN HFA) 108 (90 BASE) MCG/ACT INHALER    Inhale 2 puffs into the lungs every 6 (six) hours as needed for wheezing or shortness of breath.   PREDNISONE (DELTASONE) 10 MG TABLET    Take 5 tablets (50 mg total) by mouth daily.   SPACER/AERO CHAMBER MOUTHPIECE MISC    1 Units by Does not apply route every 4 (four) hours as needed (wheezing).     Note:  This document was prepared using Dragon voice recognition software and may include unintentional  dictation errors.     Merrily Brittle, MD 07/21/17 2324

## 2017-08-03 ENCOUNTER — Emergency Department: Payer: Self-pay

## 2017-08-03 ENCOUNTER — Observation Stay
Admission: EM | Admit: 2017-08-03 | Discharge: 2017-08-04 | Disposition: A | Discharge status: Home / Self Care | Payer: Self-pay | Attending: Internal Medicine | Admitting: Internal Medicine

## 2017-08-03 ENCOUNTER — Encounter: Payer: Self-pay | Admitting: Emergency Medicine

## 2017-08-03 DIAGNOSIS — J45901 Unspecified asthma with (acute) exacerbation: Secondary | ICD-10-CM | POA: Diagnosis present

## 2017-08-03 DIAGNOSIS — Z79899 Other long term (current) drug therapy: Secondary | ICD-10-CM | POA: Insufficient documentation

## 2017-08-03 DIAGNOSIS — J4551 Severe persistent asthma with (acute) exacerbation: Principal | ICD-10-CM | POA: Insufficient documentation

## 2017-08-03 DIAGNOSIS — J9601 Acute respiratory failure with hypoxia: Secondary | ICD-10-CM | POA: Insufficient documentation

## 2017-08-03 DIAGNOSIS — Z6841 Body Mass Index (BMI) 40.0 and over, adult: Secondary | ICD-10-CM | POA: Insufficient documentation

## 2017-08-03 MED ORDER — ONDANSETRON HCL 4 MG PO TABS: 4.0000 mg | ORAL_TABLET | Freq: Four times a day (QID) | ORAL | Status: DC | PRN

## 2017-08-03 MED ORDER — SODIUM CHLORIDE 0.9 % IV SOLN: 250.0000 mL | INTRAVENOUS | Status: DC | PRN

## 2017-08-03 MED ORDER — IPRATROPIUM-ALBUTEROL 0.5-2.5 (3) MG/3ML IN SOLN
3.0000 mL | Freq: Once | RESPIRATORY_TRACT | Status: AC
  Administered 2017-08-03: 3 mL via RESPIRATORY_TRACT
  Filled 2017-08-03: qty 3

## 2017-08-03 MED ORDER — BUDESONIDE 0.25 MG/2ML IN SUSP
0.2500 mg | Freq: Two times a day (BID) | RESPIRATORY_TRACT | Status: DC
  Administered 2017-08-03 – 2017-08-04 (×2): 0.25 mg via RESPIRATORY_TRACT
  Filled 2017-08-03 (×2): qty 2

## 2017-08-03 MED ORDER — PREDNISONE 20 MG PO TABS
60.0000 mg | ORAL_TABLET | Freq: Once | ORAL | Status: AC
  Administered 2017-08-03: 60 mg via ORAL
  Filled 2017-08-03: qty 3

## 2017-08-03 MED ORDER — SODIUM CHLORIDE 0.9% FLUSH: 3.0000 mL | INTRAVENOUS | Status: DC | PRN

## 2017-08-03 MED ORDER — ALBUTEROL SULFATE (2.5 MG/3ML) 0.083% IN NEBU: 2.5000 mg | INHALATION_SOLUTION | RESPIRATORY_TRACT | Status: DC | PRN

## 2017-08-03 MED ORDER — ENOXAPARIN SODIUM 40 MG/0.4ML ~~LOC~~ SOLN
40.0000 mg | SUBCUTANEOUS | Status: DC
  Administered 2017-08-03: 16:00:00 40 mg via SUBCUTANEOUS
  Filled 2017-08-03: qty 0.4

## 2017-08-03 MED ORDER — SODIUM CHLORIDE 0.9% FLUSH
3.0000 mL | Freq: Two times a day (BID) | INTRAVENOUS | Status: DC
  Administered 2017-08-03 (×2): 3 mL via INTRAVENOUS

## 2017-08-03 MED ORDER — ACETAMINOPHEN 650 MG RE SUPP: 650.0000 mg | Freq: Four times a day (QID) | RECTAL | Status: DC | PRN

## 2017-08-03 MED ORDER — POLYETHYLENE GLYCOL 3350 17 G PO PACK: 17.0000 g | PACK | Freq: Every day | ORAL | Status: DC | PRN

## 2017-08-03 MED ORDER — ACETAMINOPHEN 325 MG PO TABS: 650.0000 mg | ORAL_TABLET | Freq: Four times a day (QID) | ORAL | Status: DC | PRN

## 2017-08-03 MED ORDER — ONDANSETRON HCL 4 MG/2ML IJ SOLN: 4.0000 mg | Freq: Four times a day (QID) | INTRAMUSCULAR | Status: DC | PRN

## 2017-08-03 MED ORDER — IPRATROPIUM-ALBUTEROL 0.5-2.5 (3) MG/3ML IN SOLN
3.0000 mL | RESPIRATORY_TRACT | Status: DC
  Administered 2017-08-03 – 2017-08-04 (×5): 3 mL via RESPIRATORY_TRACT
  Filled 2017-08-03 (×5): qty 3

## 2017-08-03 NOTE — H&P (Signed)
[facility] - [place] at [facility]   PATIENT NAME: Edward Jennings    MR#:  [id number]  DATE OF BIRTH:  01/31/1996  DATE OF ADMISSION:  08/03/2017  PRIMARY CARE PHYSICIAN: Patient, No Pcp Per   REQUESTING/REFERRING PHYSICIAN: Dr. Darnelle Catalan  CHIEF COMPLAINT:   Chief Complaint  Patient presents with  . Asthma    HISTORY OF PRESENT ILLNESS:  Edward Jennings  is a 21 y.o. male with a known history of Asthma, obesity here with worsening SOB/wheezing of a few days. Tried albuterol at home with no improvement. Afebrile. Chest x-ray is clear. Here patient received multiple doses of albuterol nebulizer along with prednisone. He has some improvement but still has significant wheezing and tightness in his chest. Desats into the low 80sn minimal ambulation. He has had 7 ER visits in the last 5 months. He also has symptoms of asthma daily. Does not smoke.  PAST MEDICAL HISTORY:   Past Medical History:  Diagnosis Date  . Asthma   . Obesity     PAST SURGICAL HISTORY:  History reviewed. No pertinent surgical history.  SOCIAL HISTORY:   Social History  Substance Use Topics  . Smoking status: Never Smoker  . Smokeless tobacco: Never Used  . Alcohol use Yes    FAMILY HISTORY:  History reviewed. No pertinent family history.  DRUG ALLERGIES:  No Known Allergies  REVIEW OF SYSTEMS:   Review of Systems  Constitutional: Positive for malaise/fatigue. Negative for chills and fever.  HENT: Negative for sore throat.   Eyes: Negative for blurred vision, double vision and pain.  Respiratory: Positive for cough and wheezing. Negative for hemoptysis and shortness of breath.   Cardiovascular: Negative for chest pain, palpitations, orthopnea and leg swelling.  Gastrointestinal: Negative for abdominal pain, constipation, diarrhea, heartburn, nausea and vomiting.  Genitourinary: Negative for dysuria and hematuria.  Musculoskeletal: Negative for back pain and joint pain.  Skin: Negative  for rash.  Neurological: Positive for weakness. Negative for sensory change, speech change, focal weakness and headaches.  Endo/Heme/Allergies: Does not bruise/bleed easily.  Psychiatric/Behavioral: Negative for depression. The patient is not nervous/anxious.     MEDICATIONS AT HOME:   Prior to Admission medications   Medication Sig Start Date End Date Taking? Authorizing Provider  albuterol (PROVENTIL HFA;VENTOLIN HFA) 108 (90 Base) MCG/ACT inhaler Inhale 2 puffs into the lungs every 6 (six) hours as needed for wheezing or shortness of breath. 07/21/17  Yes Merrily Brittle, MD  Spacer/Aero Chamber Mouthpiece MISC 1 Units by Does not apply route every 4 (four) hours as needed (wheezing). 07/21/17  Yes Merrily Brittle, MD     VITAL SIGNS:  Blood pressure (!) 155/79, pulse 80, temperature 98.3 F (36.8 C), temperature source Axillary, resp. rate 17, height  (1.727 m), weight 122.5 kg (270 lb), SpO2 98 %.  PHYSICAL EXAMINATION:  Physical Exam  GENERAL:  21 y.o.-year-old patient lying in the bed with conversational dyspnea. Morbidly obese EYES: Pupils equal, round, reactive to light and accommodation. No scleral icterus. Extraocular muscles intact.  HEENT: Head atraumatic, normocephalic. Oropharynx and nasopharynx clear. No oropharyngeal erythema, moist oral mucosa  NECK:  Supple, no jugular venous distention. No thyroid enlargement, no tenderness.  LUNGS: decreased air entry bilaterally with expiratory wheezing CARDIOVASCULAR: S1, S2 normal. No murmurs, rubs, or gallops.  ABDOMEN: Soft, nontender, nondistended. Bowel sounds present. No organomegaly or mass.  EXTREMITIES: No pedal edema, cyanosis, or clubbing. + 2 pedal & radial pulses b/l.   NEUROLOGIC: Cranial nerves II through XII  are intact. No focal Motor or sensory deficits appreciated b/l PSYCHIATRIC: The patient is alert and oriented x 3. Good affect.  SKIN: No obvious rash, lesion, or ulcer.   LABORATORY PANEL:   CBC No  results for input(s): WBC, HGB, HCT, PLT in the last 168 hours. ------------------------------------------------------------------------------------------------------------------  Chemistries  No results for input(s): NA, K, CL, CO2, GLUCOSE, BUN, CREATININE, CALCIUM, MG, AST, ALT, ALKPHOS, BILITOT in the last 168 hours.  Invalid input(s): GFRCGP ------------------------------------------------------------------------------------------------------------------  Cardiac Enzymes No results for input(s): TROPONINI in the last 168 hours. ------------------------------------------------------------------------------------------------------------------  RADIOLOGY:  Dg Chest 2 View  Result Date: 08/03/2017 CLINICAL DATA:  Asthma attack.  Shortness breath, productive cough EXAM: CHEST  2 VIEW COMPARISON:  07/21/2017 FINDINGS: Normal mediastinum and cardiac silhouette. Normal pulmonary vasculature. Mild peribronchial cuffing. No evidence of effusion, infiltrate, or pneumothorax. No acute bony abnormality. IMPRESSION: Mild peribronchial cuffing.  No infiltrate. Electronically Signed   By: Genevive Bi M.D.   On: 08/03/2017 09:13     IMPRESSION AND PLAN:   * Asthma exacerbation with acute hypoxic resp failure Start steroids, nebs, Oxygen He seem to have Moderate persistent asthma. Start pulmicort Will need ICS at discharge. Will consult pulmonary as he has no f/u as OP  * Morbid obesity  * DVT prophylaxis with Lovenox  All the records are reviewed and case discussed with ED provider. Management plans discussed with the patient, family and they are in agreement.  CODE STATUS: FULL CODE  TOTAL TIME TAKING CARE OF THIS PATIENT: 40 minutes.   Milagros Loll R M.D on 08/03/2017 at 10:31 AM  Between 7am to 6pm - Pager - [phone]  After 6pm go to [email or web address] - password [email or web address]  [facility]  Office  [phone]  CC: Primary care physician; Patient, No Pcp  Per  Note: This dictation was prepared with Dragon dictation along with smaller phrase technology. Any transcriptional errors that result from this process are unintentional.

## 2017-08-03 NOTE — ED Triage Notes (Signed)
Pt came EMS for asthma flare. 2 duoneb, 1 albuterol, and 125 mg solumedrol by EMS. They report diminished breath sounds with increased WOB on their arrival. Pt has improved per report with treatments.

## 2017-08-03 NOTE — ED Notes (Signed)
Patient transported to X-ray 

## 2017-08-03 NOTE — Consult Note (Signed)
Pulmonary Critical Care  Initial Consult Note   Edward Jennings ZOX:[id number] DOB: 22-Jan-1996 DOA: 08/03/2017  Referring physician: Elpidio Anis PCP: Patient, No Pcp Per   Chief Complaint: Asthma  HPI: Edward Jennings is a 21 y.o. male With   Asthma and no significant  Other past medical history who presents to hospital with increasing shortness of breath. Patient is apparently been visiting the emergency room several times over the course of the last few months.  He states that he has albuterol but does not have any other medication for maintenance.  Patient states he is not really able to afford any other medications because he has no insurance at this time.  On presentation to the hospital this time he was noted to be significantly hypoxic he did not have any changes noted on the chest x-ray.  He states that now he is feeling much better.  As far as allergies are concern E says he does have symptoms of sneezing and stuffy nose but has never really been evaluated for any kind of allergy therapy.  Also states that he has snoring and but has never been tested for sleep apnea.   Review of Systems:  Constitutional:  No weight loss, +night sweats, Fevers, chills, fatigue.  HEENT:  No headaches, nasal congestion, post nasal drip,  Cardio-vascular:  No chest pain, anasarca, dizziness, palpitations  GI:  No heartburn, indigestion, abdominal pain, nausea, vomiting, diarrhea  Resp:  +shortness of breath. no productive cough, No coughing up of blood.+wheezing Skin:  no rash or lesions.  Musculoskeletal:  No joint pain or swelling.   Remainder ROS performed and is unremarkable other than noted in HPI  Past Medical History:  Diagnosis Date  . Asthma   . Obesity    History reviewed. No pertinent surgical history. Social History:  reports that he has never smoked. He has never used smokeless tobacco. He reports that he drinks alcohol. He reports that he does not use drugs.  No Known Allergies  History  reviewed. No pertinent family history.  Prior to Admission medications   Medication Sig Start Date End Date Taking? Authorizing Provider  albuterol (PROVENTIL HFA;VENTOLIN HFA) 108 (90 Base) MCG/ACT inhaler Inhale 2 puffs into the lungs every 6 (six) hours as needed for wheezing or shortness of breath. 07/21/17  Yes Merrily Brittle, MD  Spacer/Aero Chamber Mouthpiece MISC 1 Units by Does not apply route every 4 (four) hours as needed (wheezing). 07/21/17  Yes Merrily Brittle, MD   Physical Exam: Vitals:   08/03/17 1225 08/03/17 1513 08/03/17 1923 08/03/17 1952  BP: 136/87   123/60  Pulse: (!) 107   (!) 121  Resp: 20   14  Temp: 98 F (36.7 C)   98.1 F (36.7 C)  TempSrc: Oral   Oral  SpO2: 96% 96% 95% 94%  Weight: (!) 314 lb 3.2 oz (142.5 kg)     Height:  (1.753 m)       Wt Readings from Last 3 Encounters:  08/03/17 (!) 314 lb 3.2 oz (142.5 kg)  07/21/17 270 lb (122.5 kg)  07/16/17 280 lb (127 kg)    General:  Appears calm and comfortable Eyes: PERRL, normal lids, irises & conjunctiva ENT: grossly normal hearing, lips & tongue Neck: no LAD, masses or thyromegaly Cardiovascular: RRR, no m/r/g. No LE edema. Respiratory: CTA bilaterally, no w/r/r. Normal respiratory effort now Abdomen: soft, nontender Skin: no rash or induration seen on limited exam Musculoskeletal: grossly normal tone BUE/BLE Psychiatric: grossly normal  mood and affect Neurologic: grossly non-focal.          Labs on Admission:  Basic Metabolic Panel: No results for input(s): NA, K, CL, CO2, GLUCOSE, BUN, CREATININE, CALCIUM, MG, PHOS in the last 168 hours. Liver Function Tests: No results for input(s): AST, ALT, ALKPHOS, BILITOT, PROT, ALBUMIN in the last 168 hours. No results for input(s): LIPASE, AMYLASE in the last 168 hours. No results for input(s): AMMONIA in the last 168 hours. CBC: No results for input(s): WBC, NEUTROABS, HGB, HCT, MCV, PLT in the last 168 hours. Cardiac Enzymes: No results  for input(s): CKTOTAL, CKMB, CKMBINDEX, TROPONINI in the last 168 hours.  BNP (last 3 results) No results for input(s): BNP in the last 8760 hours.  ProBNP (last 3 results) No results for input(s): PROBNP in the last 8760 hours.  CBG: No results for input(s): GLUCAP in the last 168 hours.  Radiological Exams on Admission: Dg Chest 2 View  Result Date: 08/03/2017 CLINICAL DATA:  Asthma attack.  Shortness breath, productive cough EXAM: CHEST  2 VIEW COMPARISON:  07/21/2017 FINDINGS: Normal mediastinum and cardiac silhouette. Normal pulmonary vasculature. Mild peribronchial cuffing. No evidence of effusion, infiltrate, or pneumothorax. No acute bony abnormality. IMPRESSION: Mild peribronchial cuffing.  No infiltrate. Electronically Signed   By: Genevive Bi M.D.   On: 08/03/2017 09:13    EKG: Independently reviewed.  Assessment/Plan Active Problems:   Asthma exacerbation   1.   Moderate intermittent asthma uncontrolled  basically the patient does not have good follow-up at this time he does not have primary care or a pulmonologist I spoke to him about proper asthma prevention as well as prevention of exacerbations.   The patient will need to go home on inhaled corticosteroid Would also place him on Singulair on discharge  He should continue with usage of albuterol as necessary  Would also place on taper of steroids.   Would like to see him back in the office to assess severity of his disease and also possibly assess him for allergy issues This may actually be his trigger for the exacerbations.    Code Status: full code Family Communication: none Disposition Plan: home  Time spent:    I have personally obtained a history, examined the patient, evaluated laboratory and imaging results, formulated the assessment and plan and placed orders.  The Patient requires high complexity decision making for assessment and support.    Yevonne Pax, MD [facility] Pulmonary  Critical Care Medicine Sleep Medicine

## 2017-08-03 NOTE — ED Provider Notes (Signed)
[facility] Emergency Department Provider Note   ____________________________________________   First MD Initiated Contact with Patient 08/03/17 0820     (approximate)  I have reviewed the triage vital signs and the nursing notes.   HISTORY  Chief Complaint Asthma    HPI DAGEN BEEVERS is a 21 y.o. male He has a history of asthma. He was here last month and got better. Last night he ran out of his inhaler and his steroids and got worse. When EMS came he was satting in the low 90s but was very tight and having difficulty breathing. He had to do an abs one albuterol and 1125 site Medrol by EMS is now breathing much better although he still wheezing slightly. He reports no fever but is coughing up green and yellow phlegm. Asthma flared up last night as noted above.   Past Medical History:  Diagnosis Date  . Asthma   . Obesity     There are no active problems to display for this patient.   History reviewed. No pertinent surgical history.  Prior to Admission medications   Medication Sig Start Date End Date Taking? Authorizing Provider  albuterol (PROVENTIL HFA;VENTOLIN HFA) 108 (90 Base) MCG/ACT inhaler Inhale 2 puffs into the lungs every 6 (six) hours as needed for wheezing or shortness of breath. 07/21/17  Yes Merrily Brittle, MD  Spacer/Aero Chamber Mouthpiece MISC 1 Units by Does not apply route every 4 (four) hours as needed (wheezing). 07/21/17  Yes Merrily Brittle, MD    Allergies Patient has no known allergies.  History reviewed. No pertinent family history.  Social History Social History  Substance Use Topics  . Smoking status: Never Smoker  . Smokeless tobacco: Never Used  . Alcohol use Yes    Review of Systems  Constitutional: No fever/chills Eyes: No visual changes. ENT: No sore throat. Cardiovascular: Denies chest pain. Respiratory:shortness of breath. Gastrointestinal: No abdominal pain.  No nausea, no vomiting.  No diarrhea.   No constipation. Genitourinary: Negative for dysuria. Musculoskeletal: Negative for back pain. Skin: Negative for rash. Neurological: Negative for headaches, focal weakness   ____________________________________________   PHYSICAL EXAM:  VITAL SIGNS: ED Triage Vitals  Enc Vitals Group     BP 08/03/17 0814 (!) 152/92     Pulse Rate 08/03/17 0814 (!) 106     Resp 08/03/17 0814 19     Temp 08/03/17 0814 98.3 F (36.8 C)     Temp Source 08/03/17 0814 Axillary     SpO2 08/03/17 0814 100 %     Weight 08/03/17 0814 270 lb (122.5 kg)     Height 08/03/17 0814  (1.727 m)     Head Circumference --      Peak Flow --      Pain Score 08/03/17 0813 3     Pain Loc --      Pain Edu? --      Excl. in GC? --     Constitutional: Alert and oriented. Well appearing and in no acute distress. Eyes: Conjunctivae are normal.  Head: Atraumatic. Nose: No congestion/rhinnorhea. Mouth/Throat: Mucous membranes are moist.  Oropharynx non-erythematous. Neck: No stridor.   Cardiovascular: Normal rate, regular rhythm. Grossly normal heart sounds.  Good peripheral circulation. Respiratory: Normal respiratory effort.  No retractions. Lungs diffuse wheezing Gastrointestinal: Soft and nontender. No distention. No abdominal bruits. No CVA tenderness. {Musculoskeletal: No lower extremity tenderness nor edema.  No joint effusions. Neurologic:  Normal speech and language. No gross focal neurologic  deficits are appreciated. No gait instability. Skin:  Skin is warm, dry and intact. No rash noted. Psychiatric: Mood and affect are normal. Speech and behavior are normal.  ____________________________________________   LABS (all labs ordered are listed, but only abnormal results are displayed)  Labs Reviewed - No data to display ____________________________________________  EKG   ____________________________________________  RADIOLOGY the x-ray looks fairly clear to  me  ____________________________________________   PROCEDURES  Procedure(s) performed:  Procedures  Critical Care performed:   ____________________________________________   INITIAL IMPRESSION / ASSESSMENT AND PLAN / ED COURSE  patient doing well after finishing his breathing treatment and some wheezing left a little bit tight on auscultation however when he sits up for me to auscultate his back and he lays back down again his O2 sat dropped from 92-88 just with that little bit of activity. He reports he's been in and out of the emergency room frequently this year much more often than usual. He does not have a primary care doctor. It appears to me that he needs more than just an albuterol inhaler to get his asthma under control and he needs a primary care doctor. We will plan on giving him a DuoNeb and some by mouth prednisone watching him for somewhat longer and then attempting to ambulate him in the emergency room if his O2 sat drops like it did this time we will plan on putting him in the hospital. Review of his old records backs up his report that he's been coming and more frequently.he's had 7 visits so far this summer and only 2 visits in the past 2 years. ----------------------------------------- 10:05 AM on 08/03/2017 -----------------------------------------  Patient now sitting in bed O2 sats are 88. We will admit him.      ____________________________________________   FINAL CLINICAL IMPRESSION(S) / ED DIAGNOSES  Final diagnoses:  Severe persistent asthma with exacerbation      NEW MEDICATIONS STARTED DURING THIS VISIT:  New Prescriptions   No medications on file     Note:  This document was prepared using Dragon voice recognition software and may include unintentional dictation errors.    Arnaldo Natal, MD 08/03/17 1005

## 2017-08-03 NOTE — ED Notes (Signed)
Sitting in bed. No needs currently. Informed of plan for admission

## 2017-08-04 MED ORDER — PREDNISONE 10 MG PO TABS: ORAL_TABLET | ORAL | 0 refills | Status: DC

## 2017-08-04 MED ORDER — PREDNISONE 50 MG PO TABS
50.0000 mg | ORAL_TABLET | Freq: Every day | ORAL | Status: DC
  Administered 2017-08-04: 50 mg via ORAL
  Filled 2017-08-04: qty 1

## 2017-08-04 MED ORDER — MONTELUKAST SODIUM 10 MG PO TABS: 10.0000 mg | ORAL_TABLET | Freq: Every day | ORAL | Status: DC

## 2017-08-04 MED ORDER — MONTELUKAST SODIUM 10 MG PO TABS: 10.0000 mg | ORAL_TABLET | Freq: Every day | ORAL | 1 refills | Status: DC

## 2017-08-04 MED ORDER — FLUTICASONE-SALMETEROL 250-50 MCG/DOSE IN AEPB: 1.0000 | INHALATION_SPRAY | Freq: Two times a day (BID) | RESPIRATORY_TRACT | 1 refills | Status: DC

## 2017-08-04 NOTE — Care Management Note (Signed)
Case Management Note  Patient Details  Name: Edward Jennings MRN: [id number] Date of Birth: 12-20-1995  Subjective/Objective:     Admitted to [facility] under observation status with the diagnosis of asthma. Lives with mother, Edward Jennings and brother [phone]), No primary care physician. No insurance. States he has worked at [employer] x 4 years, will have insurance next year. Gets prescriptions filled at [facility] on [address]. Will be able to fill prescriptions tomorrow. Takes care of all basic activities of daily living himself, drives. No falls. Good appetite. Brother will transport               Action/Plan: Income is over $20,000.00 a year. Encouraged to call [facility] and arrange follow-up appointment based on income.  Discharge to home today per Dr. Imogene Burn   Expected Discharge Date:  08/04/17               Expected Discharge Plan:     In-House Referral:     Discharge planning Services     Post Acute Care Choice:    Choice offered to:     DME Arranged:    DME Agency:     HH Arranged:    HH Agency:     Status of Service:     If discussed at [employer] of Stay Meetings, dates discussed:    Additional Comments:  Gwenette Greet, RNMSN CCM Care Management [phone] 08/04/2017, 9:25 AM

## 2017-08-04 NOTE — Discharge Instructions (Signed)
Heart healthy diet

## 2017-08-04 NOTE — Progress Notes (Signed)
Discharge paperwork reviewed with patient who verbalized understanding. Pt to follow up at [facility] since he does not have PCP and Dr. Welton Flakes with pulmonology. Pt educated on new prescriptions & attached to packet. Lungs clear, oxygen saturations stable on room air. Pt friend to transport home.

## 2017-08-05 LAB — HIV ANTIBODY (ROUTINE TESTING W REFLEX): HIV SCREEN 4TH GENERATION: NONREACTIVE

## 2017-08-05 NOTE — Discharge Summary (Signed)
[facility] [place] [facility]   PATIENT NAME: Edward Jennings    MR#:  [id number]  DATE OF BIRTH:  12/14/1995  DATE OF ADMISSION:  08/03/2017   ADMITTING PHYSICIAN: Milagros Loll, MD  DATE OF DISCHARGE: 08/04/2017 10:30 AM  PRIMARY CARE PHYSICIAN: Patient, No Pcp Per   ADMISSION DIAGNOSIS:  Severe persistent asthma with exacerbation [J45.51] DISCHARGE DIAGNOSIS:  Active Problems:   Asthma exacerbation  SECONDARY DIAGNOSIS:   Past Medical History:  Diagnosis Date  . Asthma   . Obesity    HOSPITAL COURSE:  * Asthma exacerbation with acute hypoxic resp failure He was treated with steroids, nebs, off Oxygen Start Singulair and pulmicort per Dr. Welton Flakes. Taper steroid, continue albuterol when necessary. Follow-up with Dr.Khan as outpatient.  * Morbid obesity DISCHARGE CONDITIONS:  Stable discharged to home. CONSULTS OBTAINED:   DRUG ALLERGIES:  No Known Allergies DISCHARGE MEDICATIONS:   Allergies as of 08/04/2017   No Known Allergies     Medication List    TAKE these medications   albuterol 108 (90 Base) MCG/ACT inhaler Commonly known as:  PROVENTIL HFA;VENTOLIN HFA Inhale 2 puffs into the lungs every 6 (six) hours as needed for wheezing or shortness of breath.   Fluticasone-Salmeterol 250-50 MCG/DOSE Aepb Commonly known as:  ADVAIR DISKUS Inhale 1 puff into the lungs 2 (two) times daily.   montelukast 10 MG tablet Commonly known as:  SINGULAIR Take 1 tablet (10 mg total) by mouth at bedtime.   predniSONE 10 MG tablet Commonly known as:  DELTASONE 40 mg po daily, 30 mg po daily,20 mg po daily, 10 mg po daily.   Spacer/Aero Chamber Mouthpiece Misc 1 Units by Does not apply route every 4 (four) hours as needed (wheezing).        DISCHARGE INSTRUCTIONS:  See AVS.  If you experience worsening of your admission symptoms, develop shortness of breath, life threatening emergency, suicidal or homicidal thoughts you must seek medical  attention immediately by calling 911 or calling your MD immediately  if symptoms less severe.  You Must read complete instructions/literature along with all the possible adverse reactions/side effects for all the Medicines you take and that have been prescribed to you. Take any new Medicines after you have completely understood and accpet all the possible adverse reactions/side effects.   Please note  You were cared for by a hospitalist during your hospital stay. If you have any questions about your discharge medications or the care you received while you were in the hospital after you are discharged, you can call the unit and asked to speak with the hospitalist on call if the hospitalist that took care of you is not available. Once you are discharged, your primary care physician will handle any further medical issues. Please note that NO REFILLS for any discharge medications will be authorized once you are discharged, as it is imperative that you return to your primary care physician (or establish a relationship with a primary care physician if you do not have one) for your aftercare needs so that they can reassess your need for medications and monitor your lab values.    On the day of Discharge:  VITAL SIGNS:  Blood pressure 127/76, pulse (!) 103, temperature 97.9 F (36.6 C), temperature source Oral, resp. rate 15, height  (1.753 m), weight (!) 315 lb 11.2 oz (143.2 kg), SpO2 97 %. PHYSICAL EXAMINATION:  GENERAL:  21 y.o.-year-old patient lying in the bed with no acute distress. Morbid obesity.  EYES: Pupils equal, round, reactive to light and accommodation. No scleral icterus. Extraocular muscles intact.  HEENT: Head atraumatic, normocephalic. Oropharynx and nasopharynx clear.  NECK:  Supple, no jugular venous distention. No thyroid enlargement, no tenderness.  LUNGS: Normal breath sounds bilaterally, no wheezing, rales,rhonchi or crepitation. No use of accessory muscles of respiration.    CARDIOVASCULAR: S1, S2 normal. No murmurs, rubs, or gallops.  ABDOMEN: Soft, non-tender, non-distended. Bowel sounds present. No organomegaly or mass.  EXTREMITIES: No pedal edema, cyanosis, or clubbing.  NEUROLOGIC: Cranial nerves II through XII are intact. Muscle strength 5/5 in all extremities. Sensation intact. Gait not checked.  PSYCHIATRIC: The patient is alert and oriented x 3.  SKIN: No obvious rash, lesion, or ulcer.  DATA REVIEW:   CBC No results for input(s): WBC, HGB, HCT, PLT in the last 168 hours.  Chemistries  No results for input(s): NA, K, CL, CO2, GLUCOSE, BUN, CREATININE, CALCIUM, MG, AST, ALT, ALKPHOS, BILITOT in the last 168 hours.  Invalid input(s): GFRCGP   Microbiology Results  No results found for this or any previous visit.  RADIOLOGY:  No results found.   Management plans discussed with the patient, family and they are in agreement.  CODE STATUS: Prior   TOTAL TIME TAKING CARE OF THIS PATIENT: 22  minutes.    Shaune Pollack M.D on 08/05/2017 at 6:06 AM  Between 7am to 6pm - Pager - [phone]  After 6pm go to [email or web address] - Social research officer, government  [facility] [place] [facility]  Office  [phone]  CC: Primary care physician; Patient, No Pcp Per   Note: This dictation was prepared with Dragon dictation along with smaller phrase technology. Any transcriptional errors that result from this process are unintentional.

## 2017-11-10 ENCOUNTER — Encounter: Payer: Self-pay | Admitting: Emergency Medicine

## 2017-11-10 ENCOUNTER — Emergency Department
Admission: EM | Admit: 2017-11-10 | Discharge: 2017-11-10 | Disposition: A | Discharge status: Home / Self Care | Payer: BLUE CROSS/BLUE SHIELD | Attending: Emergency Medicine | Admitting: Emergency Medicine

## 2017-11-10 DIAGNOSIS — R062 Wheezing: Secondary | ICD-10-CM | POA: Diagnosis present

## 2017-11-10 DIAGNOSIS — J45901 Unspecified asthma with (acute) exacerbation: Secondary | ICD-10-CM | POA: Diagnosis not present

## 2017-11-10 DIAGNOSIS — Z79899 Other long term (current) drug therapy: Secondary | ICD-10-CM | POA: Diagnosis not present

## 2017-11-10 MED ORDER — ALBUTEROL SULFATE HFA 108 (90 BASE) MCG/ACT IN AERS: 2.0000 | INHALATION_SPRAY | Freq: Once | RESPIRATORY_TRACT | Status: DC

## 2017-11-10 MED ORDER — ALBUTEROL SULFATE (2.5 MG/3ML) 0.083% IN NEBU: 2.5000 mg | INHALATION_SOLUTION | Freq: Once | RESPIRATORY_TRACT | Status: DC

## 2017-11-10 MED ORDER — ALBUTEROL SULFATE (2.5 MG/3ML) 0.083% IN NEBU: 2.5000 mg | INHALATION_SOLUTION | Freq: Four times a day (QID) | RESPIRATORY_TRACT | 0 refills | Status: DC | PRN

## 2017-11-10 MED ORDER — ALBUTEROL SULFATE HFA 108 (90 BASE) MCG/ACT IN AERS
1.0000 | INHALATION_SPRAY | Freq: Once | RESPIRATORY_TRACT | Status: DC
  Filled 2017-11-10: qty 6.7

## 2017-11-10 MED ORDER — IPRATROPIUM-ALBUTEROL 0.5-2.5 (3) MG/3ML IN SOLN
3.0000 mL | Freq: Once | RESPIRATORY_TRACT | Status: AC
  Administered 2017-11-10: 3 mL via RESPIRATORY_TRACT

## 2017-11-10 MED ORDER — METHYLPREDNISOLONE SODIUM SUCC 125 MG IJ SOLR
125.0000 mg | Freq: Once | INTRAMUSCULAR | Status: AC
Start: 2017-11-10 — End: 2017-11-10
  Administered 2017-11-10: 125 mg via INTRAMUSCULAR
  Filled 2017-11-10: qty 2

## 2017-11-10 MED ORDER — IPRATROPIUM-ALBUTEROL 0.5-2.5 (3) MG/3ML IN SOLN
RESPIRATORY_TRACT | Status: AC
  Administered 2017-11-10: 3 mL via RESPIRATORY_TRACT
  Filled 2017-11-10: qty 6

## 2017-11-10 MED ORDER — ALBUTEROL SULFATE HFA 108 (90 BASE) MCG/ACT IN AERS: 2.0000 | INHALATION_SPRAY | Freq: Four times a day (QID) | RESPIRATORY_TRACT | 0 refills | Status: DC | PRN

## 2017-11-10 NOTE — ED Provider Notes (Signed)
[facility] Emergency Department Provider Note  ____________________________________________  Time seen: Approximately 10:06 PM  I have reviewed the triage vital signs and the nursing notes.    HISTORY  Chief Complaint Asthma    HPI Edward Jennings is a 22 y.o. male that presents to the emergency department for evaluation of wheezing and shortness of breath for 5 hours.  Patient has had asthma for 3 years.  He usually uses his albuterol inhaler 4 times per day.  He ran out of his inhaler this morning. He has a nebulizer machine but no medication for them. No cough, CP.   Past Medical History:  Diagnosis Date  . Asthma   . Obesity     Patient Active Problem List   Diagnosis Date Noted  . Asthma exacerbation 08/03/2017    History reviewed. No pertinent surgical history.  Prior to Admission medications   Medication Sig Start Date End Date Taking? Authorizing Provider  albuterol (PROVENTIL HFA;VENTOLIN HFA) 108 (90 Base) MCG/ACT inhaler Inhale 2 puffs into the lungs every 6 (six) hours as needed for wheezing or shortness of breath. 11/10/17   Enid Derry, PA-C  albuterol (PROVENTIL) (2.5 MG/3ML) 0.083% nebulizer solution Take 3 mLs (2.5 mg total) by nebulization every 6 (six) hours as needed for wheezing or shortness of breath. 11/10/17   Enid Derry, PA-C  Fluticasone-Salmeterol (ADVAIR DISKUS) 250-50 MCG/DOSE AEPB Inhale 1 puff into the lungs 2 (two) times daily. 08/04/17 08/04/18  Shaune Pollack, MD  montelukast (SINGULAIR) 10 MG tablet Take 1 tablet (10 mg total) by mouth at bedtime. 08/04/17   Shaune Pollack, MD  predniSONE (DELTASONE) 10 MG tablet 40 mg po daily, 30 mg po daily,20 mg po daily, 10 mg po daily. 08/04/17   Shaune Pollack, MD  Spacer/Aero Chamber Mouthpiece MISC 1 Units by Does not apply route every 4 (four) hours as needed (wheezing). 07/21/17   Merrily Brittle, MD    Allergies Patient has no known allergies.  No family history on file.  Social  History Social History   Tobacco Use  . Smoking status: Never Smoker  . Smokeless tobacco: Never Used  Substance Use Topics  . Alcohol use: Yes  . Drug use: No     Review of Systems  Cardiovascular: No chest pain. Respiratory: No cough.  Gastrointestinal: No abdominal pain.   Musculoskeletal: Negative for musculoskeletal pain. Skin: Negative for rash, abrasions, lacerations, ecchymosis.   ____________________________________________   PHYSICAL EXAM:  VITAL SIGNS: ED Triage Vitals [11/10/17 2041]  Enc Vitals Group     BP (!) 153/63     Pulse Rate 91     Resp 20     Temp 98.3 F (36.8 C)     Temp Source Oral     SpO2 93 %     Weight (!) 315 lb (142.9 kg)     Height 5\' 8"  (1.727 m)     Head Circumference      Peak Flow      Pain Score      Pain Loc      Pain Edu?      Excl. in GC?      Constitutional: Alert and oriented. Well appearing and in no acute distress. Eyes: Conjunctivae are normal. PERRL. EOMI. Head: Atraumatic. ENT:      Ears:      Nose: No congestion/rhinnorhea.      Mouth/Throat: Mucous membranes are moist.  Neck: No stridor.   Cardiovascular: Normal rate, regular rhythm.  Good peripheral  circulation. Respiratory: Normal respiratory effort without tachypnea or retractions. Inspiratory and expiratory wheezes. Good air entry to the bases with no decreased or absent breath sounds. Musculoskeletal: Full range of motion to all extremities. No gross deformities appreciated. Neurologic:  Normal speech and language. No gross focal neurologic deficits are appreciated.  Skin:  Skin is warm, dry and intact. No rash noted.   ____________________________________________   LABS (all labs ordered are listed, but only abnormal results are displayed)  Labs Reviewed - No data to display ____________________________________________  EKG   ____________________________________________  RADIOLOGY   No results  found.  ____________________________________________    PROCEDURES  Procedure(s) performed:    Procedures    Medications  albuterol (PROVENTIL) (2.5 MG/3ML) 0.083% nebulizer solution 2.5 mg (not administered)  ipratropium-albuterol (DUONEB) 0.5-2.5 (3) MG/3ML nebulizer solution 3 mL (3 mLs Nebulization Given 11/10/17 2119)  ipratropium-albuterol (DUONEB) 0.5-2.5 (3) MG/3ML nebulizer solution 3 mL (3 mLs Nebulization Given 11/10/17 2120)  methylPREDNISolone sodium succinate (SOLU-MEDROL) 125 mg/2 mL injection 125 mg (125 mg Intramuscular Given 11/10/17 2150)     ____________________________________________   INITIAL IMPRESSION / ASSESSMENT AND PLAN / ED COURSE  Pertinent labs & imaging results that were available during my care of the patient were reviewed by me and considered in my medical decision making (see chart for details).  Review of the [place] CSRS was performed in accordance of the [facility] prior to dispensing any controlled drugs.    Patient's diagnosis is consistent with asthma exacerbation. Vital signs and exam are reassuring. Wheezing cleared after duoneb. Patient was given an albuterol inhaler in ED. Patient will be discharged home with prescriptions for albuterol inhaler and nebulizer solution. Patient is to follow up with PCP as directed. Patient is given ED precautions to return to the ED for any worsening or new symptoms.     ____________________________________________  FINAL CLINICAL IMPRESSION(S) / ED DIAGNOSES  Final diagnoses:  Exacerbation of asthma, unspecified asthma severity, unspecified whether persistent      NEW MEDICATIONS STARTED DURING THIS VISIT:  ED Discharge Orders        Ordered    albuterol (PROVENTIL HFA;VENTOLIN HFA) 108 (90 Base) MCG/ACT inhaler  Every 6 hours PRN     11/10/17 2242    albuterol (PROVENTIL) (2.5 MG/3ML) 0.083% nebulizer solution  Every 6 hours PRN     11/10/17 2242          This chart was dictated using  voice recognition software/Dragon. Despite best efforts to proofread, errors can occur which can change the meaning. Any change was purely unintentional.    Enid DerryWagner, Ellery Tash, PA-C 11/10/17 2339    Rockne MenghiniNorman, Anne-Caroline, MD 11/16/17 [phone]

## 2017-11-10 NOTE — ED Triage Notes (Signed)
Pt comes into the ED via POV c/o asthma and increased wheezing.  Patient has recently had a cough and has run out of his inhalers at home.  Patient in NAD at this time with even and unlabored respirations.

## 2017-11-24 ENCOUNTER — Emergency Department: Payer: BLUE CROSS/BLUE SHIELD

## 2017-11-24 ENCOUNTER — Encounter: Payer: Self-pay | Admitting: Emergency Medicine

## 2017-11-24 ENCOUNTER — Emergency Department
Admission: EM | Admit: 2017-11-24 | Discharge: 2017-11-24 | Disposition: A | Discharge status: Home / Self Care | Payer: BLUE CROSS/BLUE SHIELD | Attending: Emergency Medicine | Admitting: Emergency Medicine

## 2017-11-24 DIAGNOSIS — J189 Pneumonia, unspecified organism: Secondary | ICD-10-CM

## 2017-11-24 DIAGNOSIS — J069 Acute upper respiratory infection, unspecified: Secondary | ICD-10-CM | POA: Diagnosis not present

## 2017-11-24 DIAGNOSIS — R0602 Shortness of breath: Secondary | ICD-10-CM | POA: Diagnosis present

## 2017-11-24 DIAGNOSIS — J181 Lobar pneumonia, unspecified organism: Secondary | ICD-10-CM | POA: Diagnosis not present

## 2017-11-24 DIAGNOSIS — Z79899 Other long term (current) drug therapy: Secondary | ICD-10-CM | POA: Diagnosis not present

## 2017-11-24 DIAGNOSIS — J45901 Unspecified asthma with (acute) exacerbation: Secondary | ICD-10-CM | POA: Insufficient documentation

## 2017-11-24 LAB — CBC
HCT: 44.7 % (ref 40.0–52.0)
HEMOGLOBIN: 14.7 g/dL (ref 13.0–18.0)
MCH: 28.9 pg (ref 26.0–34.0)
MCHC: 32.8 g/dL (ref 32.0–36.0)
MCV: 88.1 fL (ref 80.0–100.0)
PLATELETS: 412 10*3/uL (ref 150–440)
RBC: 5.07 MIL/uL (ref 4.40–5.90)
RDW: 14.3 % (ref 11.5–14.5)
WBC: 14.7 10*3/uL — ABNORMAL HIGH (ref 3.8–10.6)

## 2017-11-24 LAB — BASIC METABOLIC PANEL
Anion gap: 9 (ref 5–15)
BUN: 8 mg/dL (ref 6–20)
CHLORIDE: 102 mmol/L (ref 101–111)
CO2: 26 mmol/L (ref 22–32)
Calcium: 9.6 mg/dL (ref 8.9–10.3)
Creatinine, Ser: 0.93 mg/dL (ref 0.61–1.24)
Glucose, Bld: 98 mg/dL (ref 65–99)
Potassium: 4.6 mmol/L (ref 3.5–5.1)
SODIUM: 137 mmol/L (ref 135–145)

## 2017-11-24 LAB — TROPONIN I

## 2017-11-24 MED ORDER — PREDNISONE 20 MG PO TABS: 40.0000 mg | ORAL_TABLET | Freq: Every day | ORAL | 0 refills | Status: DC

## 2017-11-24 MED ORDER — IPRATROPIUM-ALBUTEROL 0.5-2.5 (3) MG/3ML IN SOLN
3.0000 mL | Freq: Once | RESPIRATORY_TRACT | Status: AC
  Administered 2017-11-24: 3 mL via RESPIRATORY_TRACT
  Filled 2017-11-24: qty 3

## 2017-11-24 MED ORDER — ALBUTEROL SULFATE (2.5 MG/3ML) 0.083% IN NEBU: 2.5000 mg | INHALATION_SOLUTION | Freq: Four times a day (QID) | RESPIRATORY_TRACT | 0 refills | Status: DC | PRN

## 2017-11-24 MED ORDER — LEVOFLOXACIN 750 MG PO TABS
750.0000 mg | ORAL_TABLET | Freq: Once | ORAL | Status: AC
  Administered 2017-11-24: 750 mg via ORAL
  Filled 2017-11-24: qty 1

## 2017-11-24 MED ORDER — METHYLPREDNISOLONE SODIUM SUCC 125 MG IJ SOLR
125.0000 mg | Freq: Once | INTRAMUSCULAR | Status: AC
  Administered 2017-11-24: 125 mg via INTRAVENOUS
  Filled 2017-11-24: qty 2

## 2017-11-24 MED ORDER — LEVOFLOXACIN 750 MG PO TABS: 750.0000 mg | ORAL_TABLET | Freq: Every day | ORAL | 0 refills | Status: AC

## 2017-11-24 NOTE — ED Notes (Signed)
Patient transported to X-ray 

## 2017-11-24 NOTE — Discharge Instructions (Signed)
As we discussed he will need a repeat x-ray in 4 weeks to ensure that the pneumonia has completely resolved and there is no underlying mass.  Please take your antibiotics as prescribed for their entire course.  Please take your steroid as prescribed for its entire course.  Please use your albuterol via nebulizer every 4 hours as needed for shortness of breath.  Return to the emergency department for any increased shortness of breath, chest pain, or any other symptom personally concerning to yourself.

## 2017-11-24 NOTE — ED Notes (Signed)
Pt on room air after breathing treatment; will monitor SpO2.  See flowsheets.

## 2017-11-24 NOTE — ED Provider Notes (Signed)
[facility] Emergency Department Provider Note  Time seen: 8:59 AM  I have reviewed the triage vital signs and the nursing notes.   HISTORY  Chief Complaint Respiratory Distress    HPI Edward Jennings is a 22 y.o. male with a past medical history of asthma who presents to the emergency department for shortness of breath.  According to the patient for the past 2-3 days he has had mild cough and congestion, for the past 3-4 hours he has felt significantly short of breath.  He has been using albuterol at home with minimal response.  Patient continued to feel short of breath so he came to the emergency department for evaluation.  Denies any fever.  States some chest tightness but denies any "pain."  Denies abdominal pain nausea vomiting or diarrhea.  Denies any urinary complaints contrary to triage note.   Past Medical History:  Diagnosis Date  . Asthma   . Obesity     Patient Active Problem List   Diagnosis Date Noted  . Asthma exacerbation 08/03/2017    History reviewed. No pertinent surgical history.  Prior to Admission medications   Medication Sig Start Date End Date Taking? Authorizing Provider  albuterol (PROVENTIL HFA;VENTOLIN HFA) 108 (90 Base) MCG/ACT inhaler Inhale 2 puffs into the lungs every 6 (six) hours as needed for wheezing or shortness of breath. 11/10/17   Enid Derry, PA-C  albuterol (PROVENTIL) (2.5 MG/3ML) 0.083% nebulizer solution Take 3 mLs (2.5 mg total) by nebulization every 6 (six) hours as needed for wheezing or shortness of breath. 11/10/17   Enid Derry, PA-C  Fluticasone-Salmeterol (ADVAIR DISKUS) 250-50 MCG/DOSE AEPB Inhale 1 puff into the lungs 2 (two) times daily. 08/04/17 08/04/18  Shaune Pollack, MD  montelukast (SINGULAIR) 10 MG tablet Take 1 tablet (10 mg total) by mouth at bedtime. 08/04/17   Shaune Pollack, MD  predniSONE (DELTASONE) 10 MG tablet 40 mg po daily, 30 mg po daily,20 mg po daily, 10 mg po daily. 08/04/17   Shaune Pollack,  MD  Spacer/Aero Chamber Mouthpiece MISC 1 Units by Does not apply route every 4 (four) hours as needed (wheezing). 07/21/17   Merrily Brittle, MD    No Known Allergies  No family history on file.  Social History Social History   Tobacco Use  . Smoking status: Never Smoker  . Smokeless tobacco: Never Used  Substance Use Topics  . Alcohol use: Yes  . Drug use: No    Review of Systems Constitutional: Negative for fever. Eyes: Negative for visual complaints ENT: Mild congestion 2-3 days. Cardiovascular: Negative for chest pain.  Positive for chest tightness. Respiratory: Positive for shortness of breath, more severe over the past 3-4 hours. Gastrointestinal: Negative for abdominal pain, vomiting and diarrhea. Genitourinary: Negative for urinary compaints.  Denies dysuria or difficulty urinating. Musculoskeletal: Negative for leg pain or swelling. Skin: Negative for skin complaints  Neurological: Negative for headache All other ROS negative  ____________________________________________   PHYSICAL EXAM:  VITAL SIGNS: ED Triage Vitals  Enc Vitals Group     BP 11/24/17 0846 (!) 149/106     Pulse Rate 11/24/17 0846 (!) 108     Resp 11/24/17 0846 (!) 30     Temp --      Temp src --      SpO2 11/24/17 0846 95 %     Weight 11/24/17 0847 (!) 315 lb (142.9 kg)     Height --      Head Circumference --  Peak Flow --      Pain Score 11/24/17 0846 7     Pain Loc --      Pain Edu? --      Excl. in GC? --    Constitutional: Alert and oriented. Well appearing and in no distress. Eyes: Normal exam ENT   Head: Normocephalic and atraumatic.   Mouth/Throat: Mucous membranes are moist. Cardiovascular: Normal rate, regular rhythm around 100 bpm with no murmur. Respiratory: Normal respiratory effort without tachypnea nor retractions. Breath sounds are clear  Gastrointestinal: Soft and nontender. No distention.   Musculoskeletal: Nontender with normal range of motion in all  extremities. No lower extremity tenderness or edema. Neurologic:  Normal speech and language. No gross focal neurologic deficits  Skin:  Skin is warm, dry and intact.  Psychiatric: Mood and affect are normal.   ____________________________________________    EKG  EKG reviewed and interpreted by myself shows sinus tachycardia 116 bpm with a narrow QRS, normal axis, largely normal intervals with nonspecific ST changes without ST elevation.  ____________________________________________    RADIOLOGY  X-ray shows opacity in the left upper lobe.  ____________________________________________   INITIAL IMPRESSION / ASSESSMENT AND PLAN / ED COURSE  Pertinent labs & imaging results that were available during my care of the patient were reviewed by me and considered in my medical decision making (see chart for details).  Patient presents to the emergency department for shortness of breath for the past 3 or 4 hours.  States cough and congestion for the past 2-3 days.  Has a history of asthma which this feels similar.  Differential would include upper respiratory infection, asthma exacerbation, pneumonia.  Patient denies smoking history.  Denies fever.  Patient's lung sounds are consistent with diffuse mild to moderate wheeze in all lung fields.  No obvious rales or rhonchi.  We will check labs, chest x-ray and EKG as a precaution.  We will treat with duo nebs and Solu-Medrol and continue to closely monitor in the emergency department.  X-ray shows an opacity in the left upper lobe.  We will cover with antibiotics.  Awaiting lab work results at this time.  The patient appears to be doing better with breathing treatments.  I suspect the patient likely has pneumonia which has exacerbated his underlying asthma.  The x-ray read does state could possibly be a malignancy as well.  Patient denies any smoking history, he is long which would make malignancy very unusual.  However patient will follow up with  his doctor in 3-4 weeks for repeat x-ray to ensure resolution.  Currently the patient appears well, satting 96% during my repeat evaluation on room air.  States he feels much better.  States he is out of albuterol for his nebulizer.  We will prescribe albuterol for his nebulizer.  We will place the patient on Levaquin, he will receive his first dose in the emergency department.  I discussed with the patient the need to follow-up with a doctor in 4 weeks for repeat x-ray.  He is agreeable to this plan.  I also discussed follow-up/return precautions for any trouble breathing developing of chest pain, patient agreeable with this plan. ____________________________________________   FINAL CLINICAL IMPRESSION(S) / ED DIAGNOSES  Asthma exacerbation Upper respiratory infection Pneumonia   Minna AntisPaduchowski, Lucille Witts, MD 11/24/17 1039

## 2017-11-24 NOTE — ED Triage Notes (Signed)
Patient presents to ED via POV from home with c/o SOB x 4 days. Hx of asthma. SpO2 88% on RA. Significant work of breathing noted. 4L O2 given to patient. SpO2 up to 95%.

## 2018-03-16 ENCOUNTER — Other Ambulatory Visit: Payer: Self-pay

## 2018-03-16 ENCOUNTER — Emergency Department
Admission: EM | Admit: 2018-03-16 | Discharge: 2018-03-16 | Disposition: A | Discharge status: Home / Self Care | Payer: BLUE CROSS/BLUE SHIELD | Attending: Emergency Medicine | Admitting: Emergency Medicine

## 2018-03-16 DIAGNOSIS — J45901 Unspecified asthma with (acute) exacerbation: Secondary | ICD-10-CM | POA: Diagnosis not present

## 2018-03-16 DIAGNOSIS — R0602 Shortness of breath: Secondary | ICD-10-CM | POA: Diagnosis present

## 2018-03-16 MED ORDER — PREDNISONE 20 MG PO TABS
60.0000 mg | ORAL_TABLET | Freq: Once | ORAL | Status: AC
  Administered 2018-03-16: 60 mg via ORAL
  Filled 2018-03-16: qty 3

## 2018-03-16 MED ORDER — MONTELUKAST SODIUM 10 MG PO TABS: 10.0000 mg | ORAL_TABLET | Freq: Every day | ORAL | 1 refills | Status: DC

## 2018-03-16 MED ORDER — ALBUTEROL SULFATE (2.5 MG/3ML) 0.083% IN NEBU: 2.5000 mg | INHALATION_SOLUTION | Freq: Four times a day (QID) | RESPIRATORY_TRACT | 0 refills | Status: DC | PRN

## 2018-03-16 MED ORDER — IPRATROPIUM-ALBUTEROL 0.5-2.5 (3) MG/3ML IN SOLN
3.0000 mL | Freq: Once | RESPIRATORY_TRACT | Status: AC
  Administered 2018-03-16: 3 mL via RESPIRATORY_TRACT
  Filled 2018-03-16: qty 3

## 2018-03-16 MED ORDER — FLUTICASONE-SALMETEROL 250-50 MCG/DOSE IN AEPB: 1.0000 | INHALATION_SPRAY | Freq: Two times a day (BID) | RESPIRATORY_TRACT | 11 refills | Status: DC

## 2018-03-16 MED ORDER — PREDNISONE 10 MG (21) PO TBPK: ORAL_TABLET | Freq: Every day | ORAL | 0 refills | Status: DC

## 2018-03-16 NOTE — ED Provider Notes (Signed)
[facility] Emergency Department Provider Note       Time seen: ----------------------------------------- 8:16 AM on 03/16/2018 -----------------------------------------   I have reviewed the triage vital signs and the nursing notes.  HISTORY   Chief Complaint Asthma    HPI Edward Jennings is a 22 y.o. male with a history of asthma and obesity who presents to the ED for an asthma exacerbation.  Patient states he has been using his albuterol inhaler without any significant improvement.  Patient states he feels like he has become immune to his albuterol inhaler and is using it daily without significant improvement.  He denies fevers, chills or other complaints.  Past Medical History:  Diagnosis Date  . Asthma   . Obesity     Patient Active Problem List   Diagnosis Date Noted  . Asthma exacerbation 08/03/2017    History reviewed. No pertinent surgical history.  Allergies Patient has no known allergies.  Social History Social History   Tobacco Use  . Smoking status: Never Smoker  . Smokeless tobacco: Never Used  Substance Use Topics  . Alcohol use: Yes  . Drug use: No   Review of Systems Constitutional: Negative for fever. Cardiovascular: Negative for chest pain. Respiratory: Positive for shortness of breath Gastrointestinal: Negative for abdominal pain, vomiting and diarrhea. Musculoskeletal: Negative for back pain. Skin: Negative for rash. Neurological: Negative for headaches, focal weakness or numbness.  All systems negative/normal/unremarkable except as stated in the HPI  ____________________________________________   PHYSICAL EXAM:  VITAL SIGNS: ED Triage Vitals  Enc Vitals Group     BP 03/16/18 0813 (!) 142/56     Pulse Rate 03/16/18 0813 95     Resp 03/16/18 0813 (!) 22     Temp 03/16/18 0813 98.4 F (36.9 C)     Temp Source 03/16/18 0813 Oral     SpO2 03/16/18 0813 94 %     Weight 03/16/18 0812 (!) 313 lb (142 kg)   Height 03/16/18 0812  (1.753 m)     Head Circumference --      Peak Flow --      Pain Score 03/16/18 0811 9     Pain Loc --      Pain Edu? --      Excl. in GC? --    Constitutional: Alert and oriented. Well appearing and in no distress. Eyes: Conjunctivae are normal. Normal extraocular movements. ENT   Head: Normocephalic and atraumatic.   Nose: No congestion/rhinnorhea.   Mouth/Throat: Mucous membranes are moist.   Neck: No stridor. Cardiovascular: Normal rate, regular rhythm. No murmurs, rubs, or gallops. Respiratory: Wheezing bilaterally, mild tachypnea Gastrointestinal: Soft and nontender. Normal bowel sounds Musculoskeletal: Nontender with normal range of motion in extremities. No lower extremity tenderness nor edema. Neurologic:  Normal speech and language. No gross focal neurologic deficits are appreciated.  Skin:  Skin is warm, dry and intact. No rash noted. ____________________________________________  ED COURSE:  As part of my medical decision making, I reviewed the following data within the electronic MEDICAL RECORD NUMBER History obtained from family if available, nursing notes, old chart and ekg, as well as notes from prior ED visits. Patient presented for dyspnea, we will assess with labs and imaging as indicated at this time.   Procedures ___________________________________________  DIFFERENTIAL DIAGNOSIS   Asthma exacerbation, medication noncompliance, URI, seasonal allergy  FINAL ASSESSMENT AND PLAN  Asthma exacerbation   Plan: The patient had presented for asthma exacerbation.  Patient was given duo nebs and  steroids with improvement in his symptoms.  We will ensure that he is on Advair as well as on albuterol.  He will be discharged with a steroid taper as well.   Ulice Dash, MD   Note: This note was generated in part or whole with voice recognition software. Voice recognition is usually quite accurate but there are  transcription errors that can and very often do occur. I apologize for any typographical errors that were not detected and corrected.     Emily Filbert, MD 03/16/18 [phone]

## 2018-03-16 NOTE — ED Triage Notes (Signed)
Pt c/o asthma flare over the past 2 hrs, states he has been using his inhaler without any relief. Pt has audible wheezing with nasal flaring noted on arrival

## 2018-03-16 NOTE — ED Notes (Signed)

## 2018-03-16 NOTE — ED Notes (Signed)
Pt reports improvement in SOB

## 2018-04-03 IMAGING — CR DG CHEST 2V
1 series · 2 of 2 positions shown · non-contrast
Comparison: 05/07/2017

CLINICAL DATA: Shortness breath and asthma beginning today.

EXAM:
CHEST  2 VIEW

[Series 1: w chest pa · 0.14mm/px · 2 of 2 slices shown]
[im 1/2]
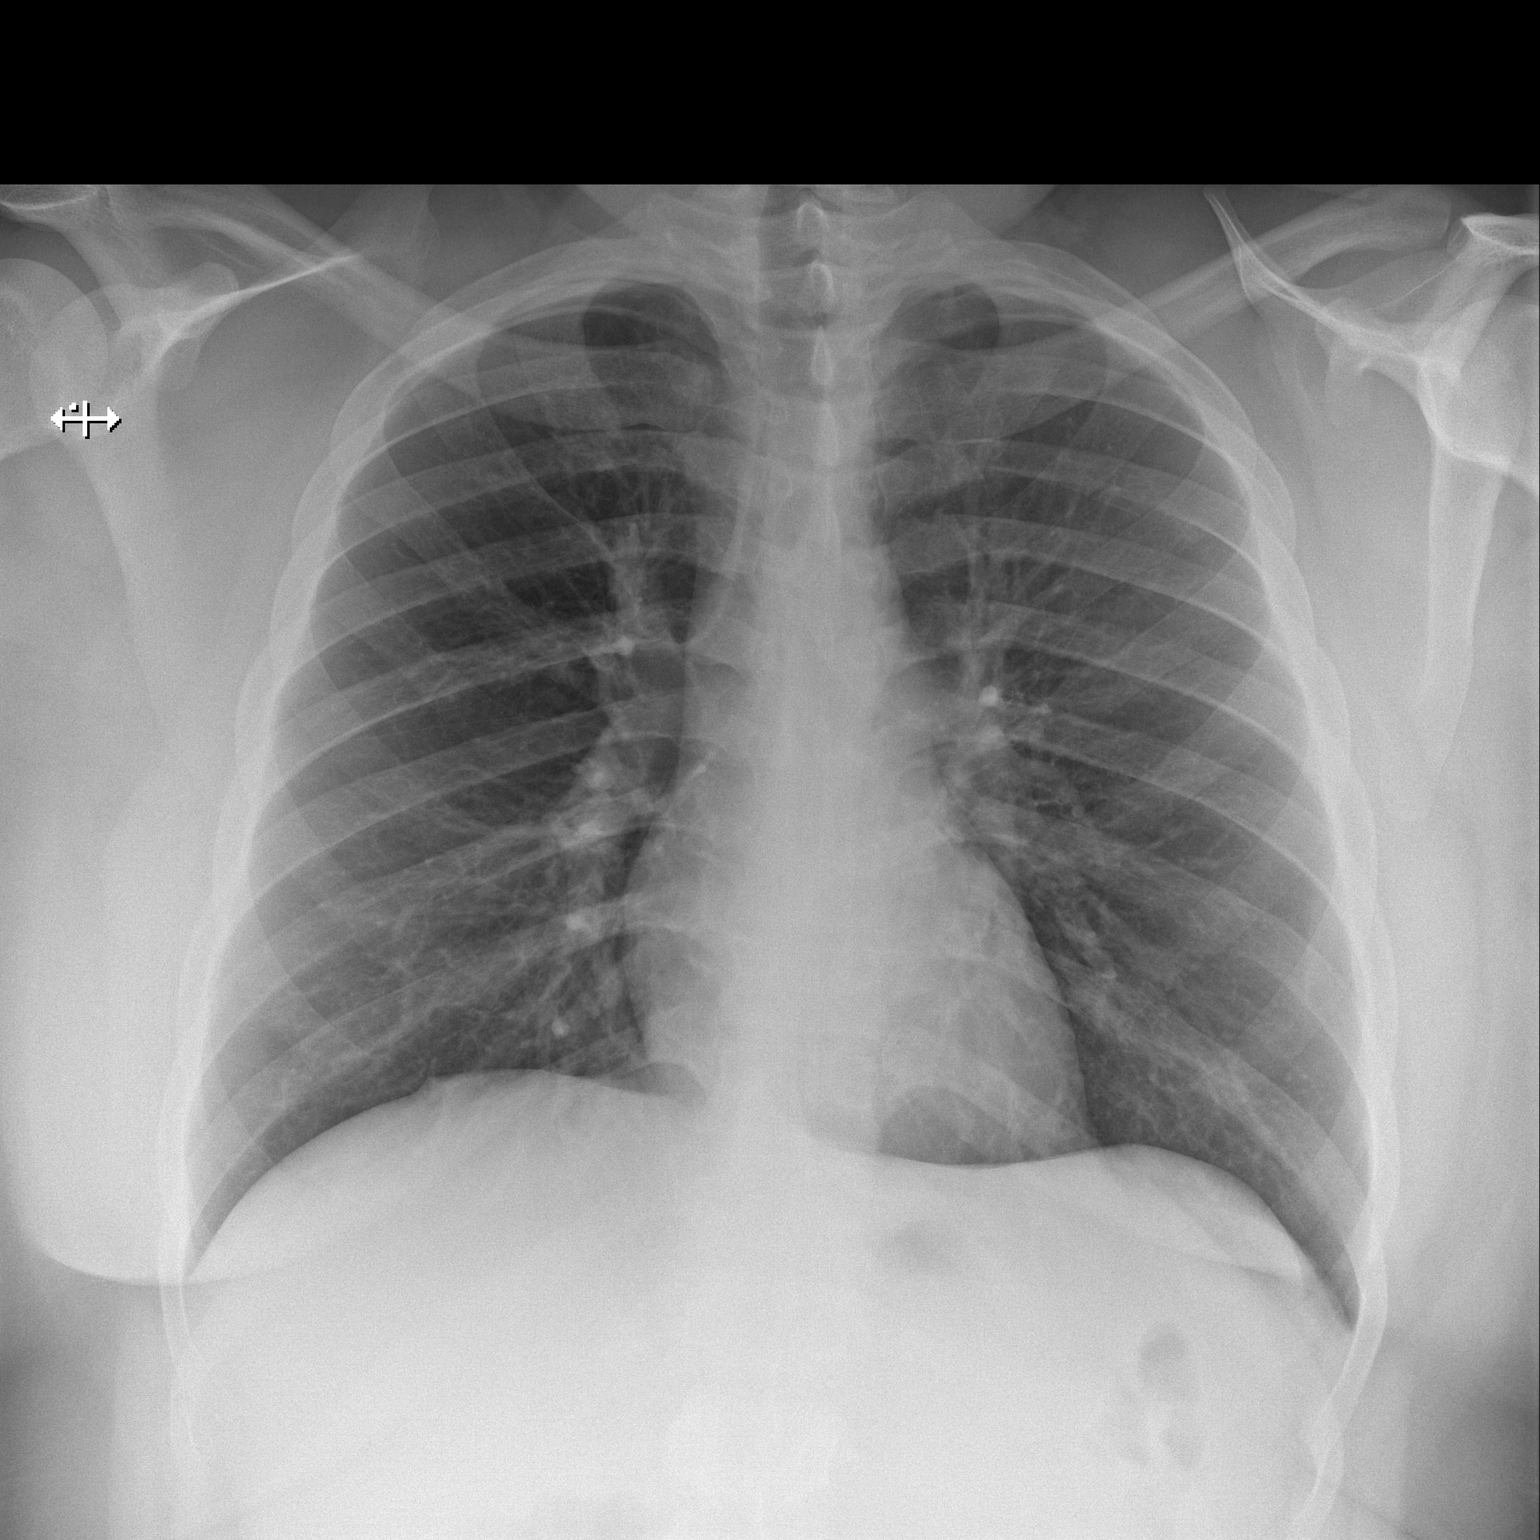
[im 2/2]
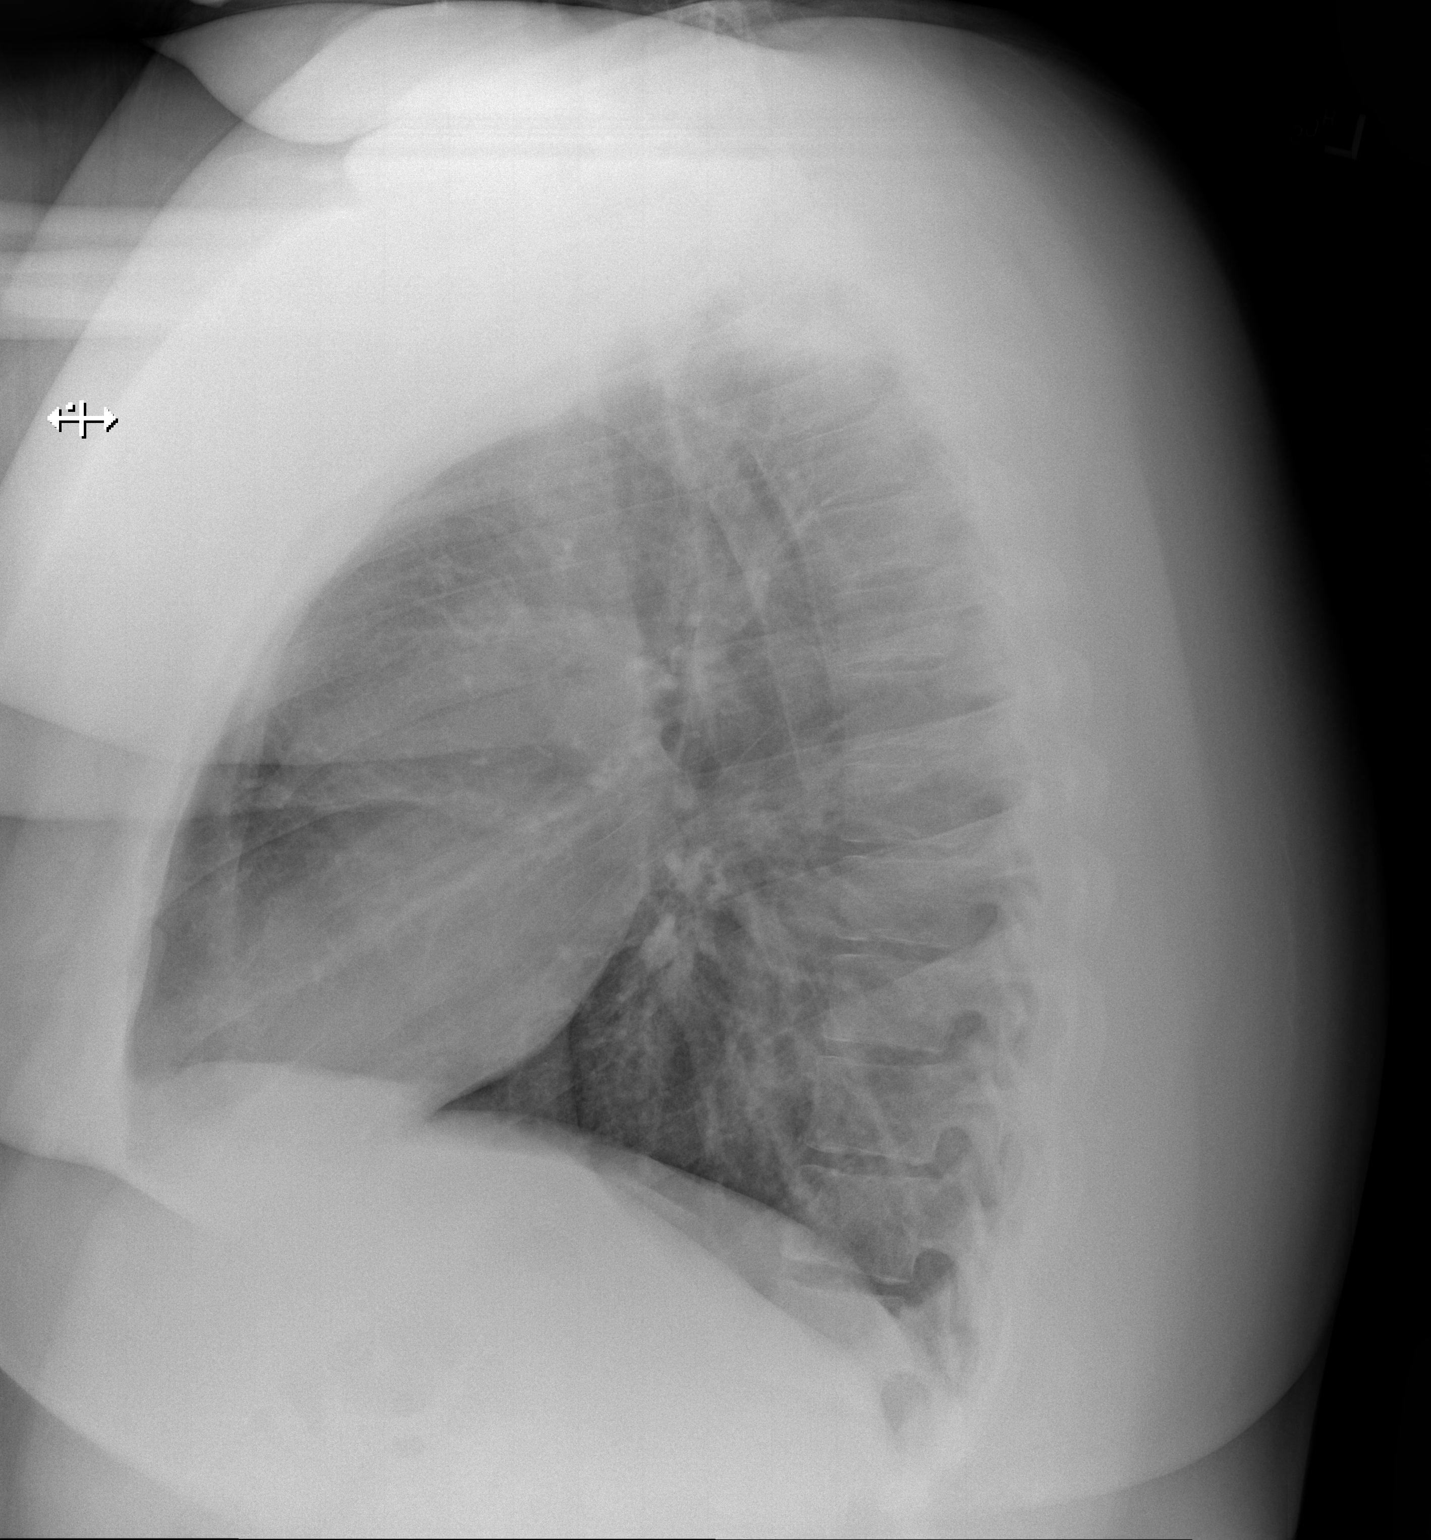

[2 of 2 positions shown; findings below may reference images not displayed]

FINDINGS: The heart size and mediastinal contours are within normal limits.
Both lungs are clear. The visualized skeletal structures are
unremarkable.
IMPRESSION: No active cardiopulmonary disease.

## 2018-04-08 IMAGING — CR DG CHEST 2V
1 series · 2 of 2 positions shown · non-contrast
Comparison: 07/16/2017

CLINICAL DATA: Asthma, dyspnea and wheeze

EXAM:
CHEST  2 VIEW

[Series 1: dg chest 2 view · 0.14mm/px · 2 of 2 slices shown]
[im 1/2]
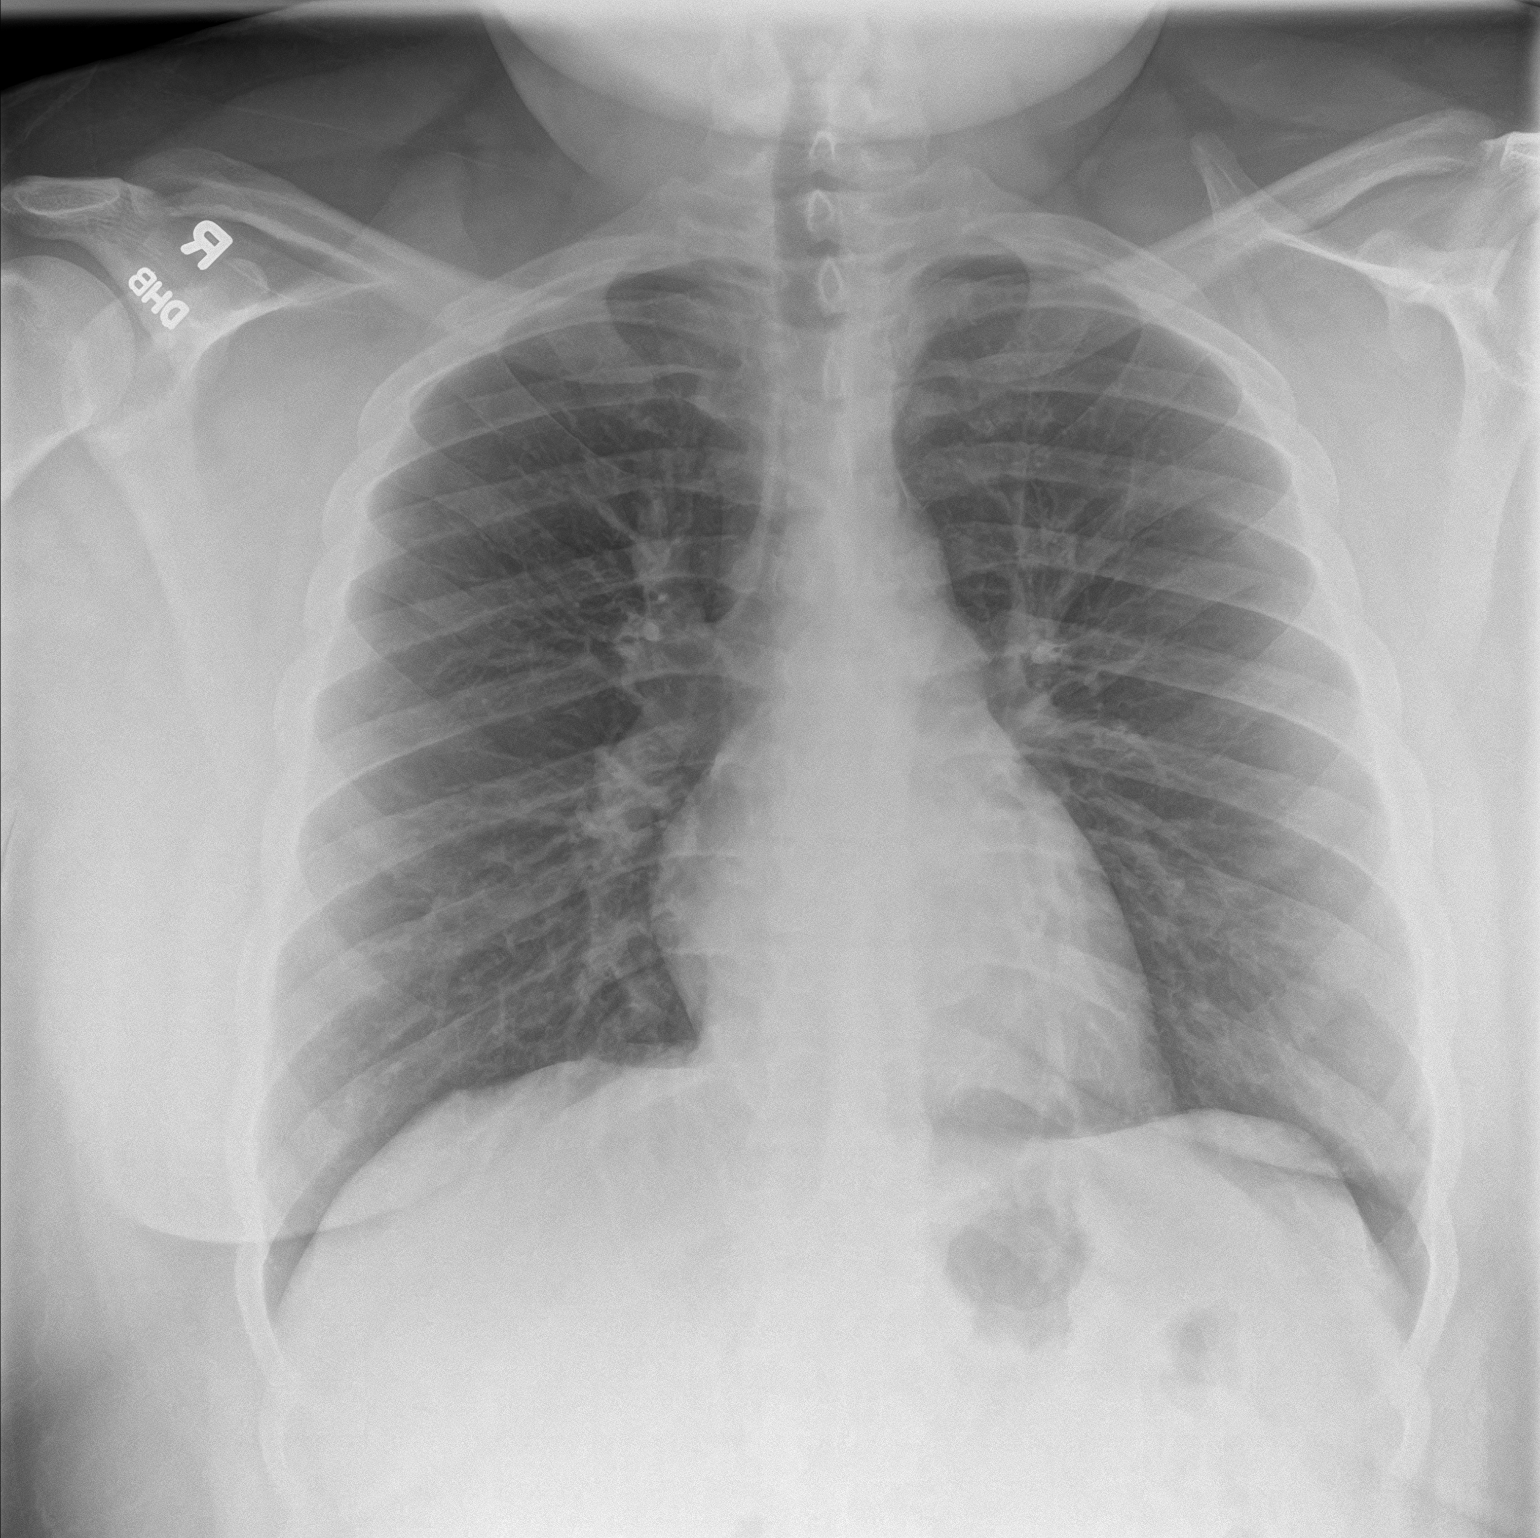
[im 2/2]
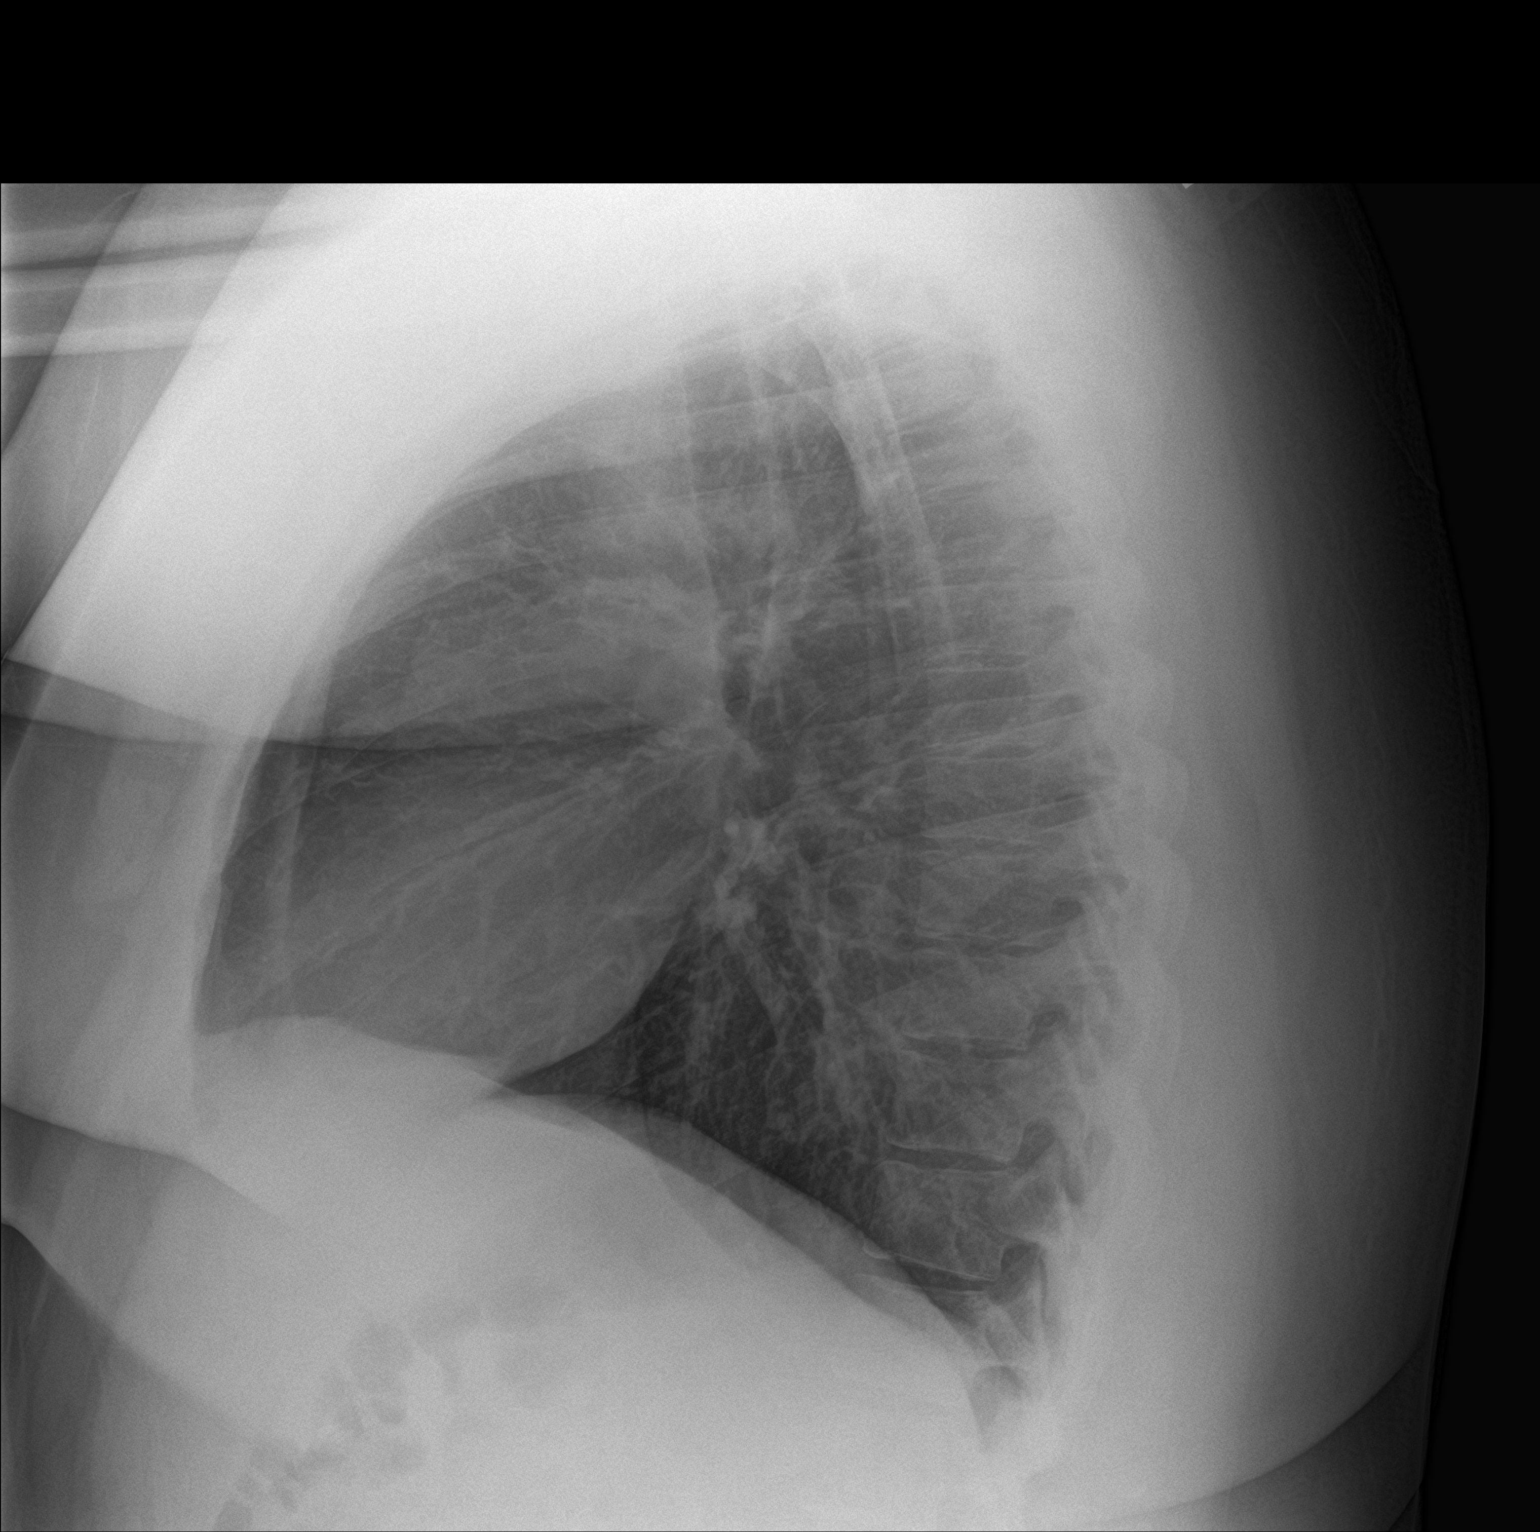

[2 of 2 positions shown; findings below may reference images not displayed]

FINDINGS: The heart size and mediastinal contours are within normal limits.
Both lungs are clear. The visualized skeletal structures are
unremarkable.
IMPRESSION: No active cardiopulmonary disease.

## 2018-04-21 IMAGING — CR DG CHEST 2V
1 series · 2 of 2 positions shown · non-contrast
Comparison: 07/21/2017

CLINICAL DATA: Asthma attack.  Shortness breath, productive cough

EXAM:
CHEST  2 VIEW

[Series 3: w chest pa · 0.14mm/px · 2 of 2 slices shown]
[im 1/2]
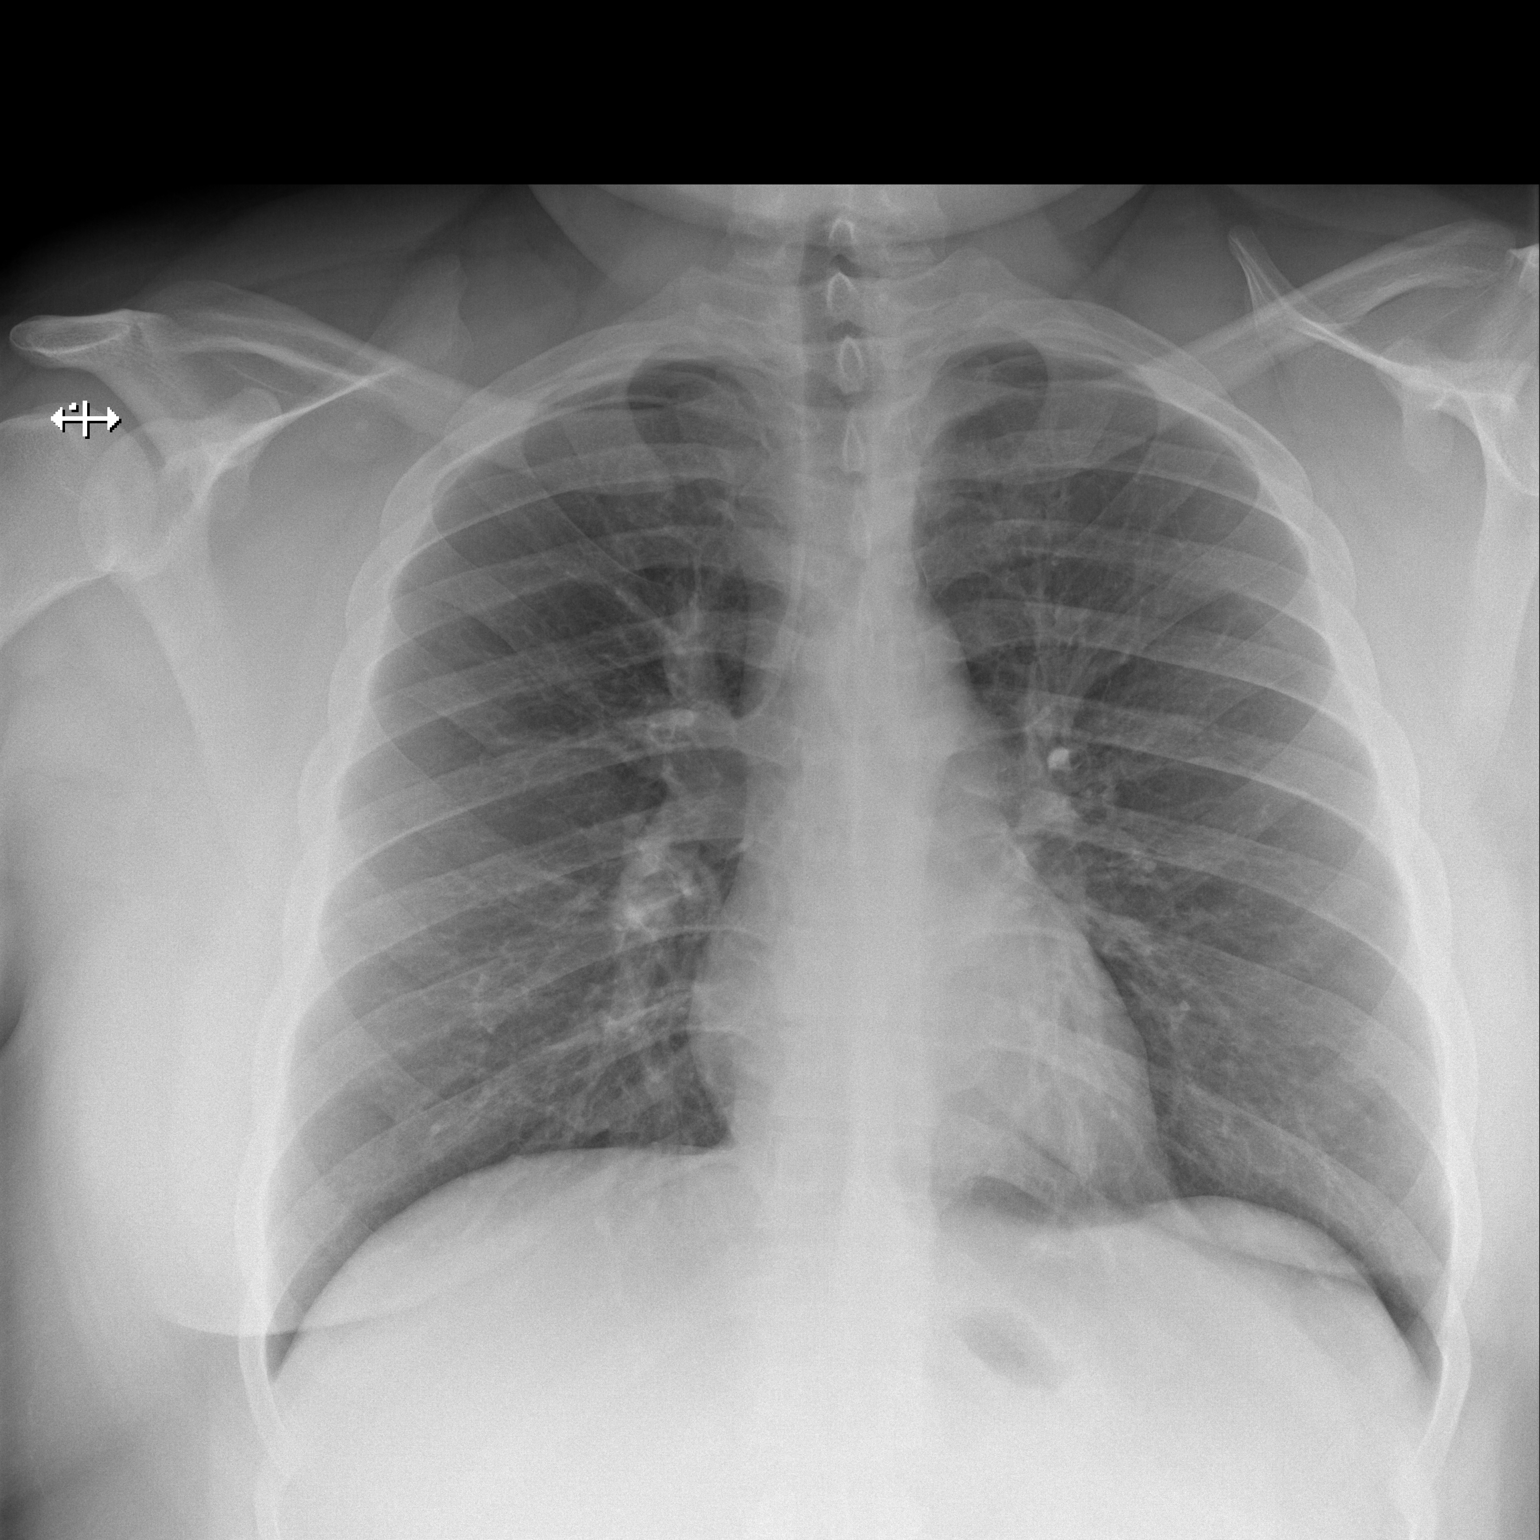
[im 2/2]
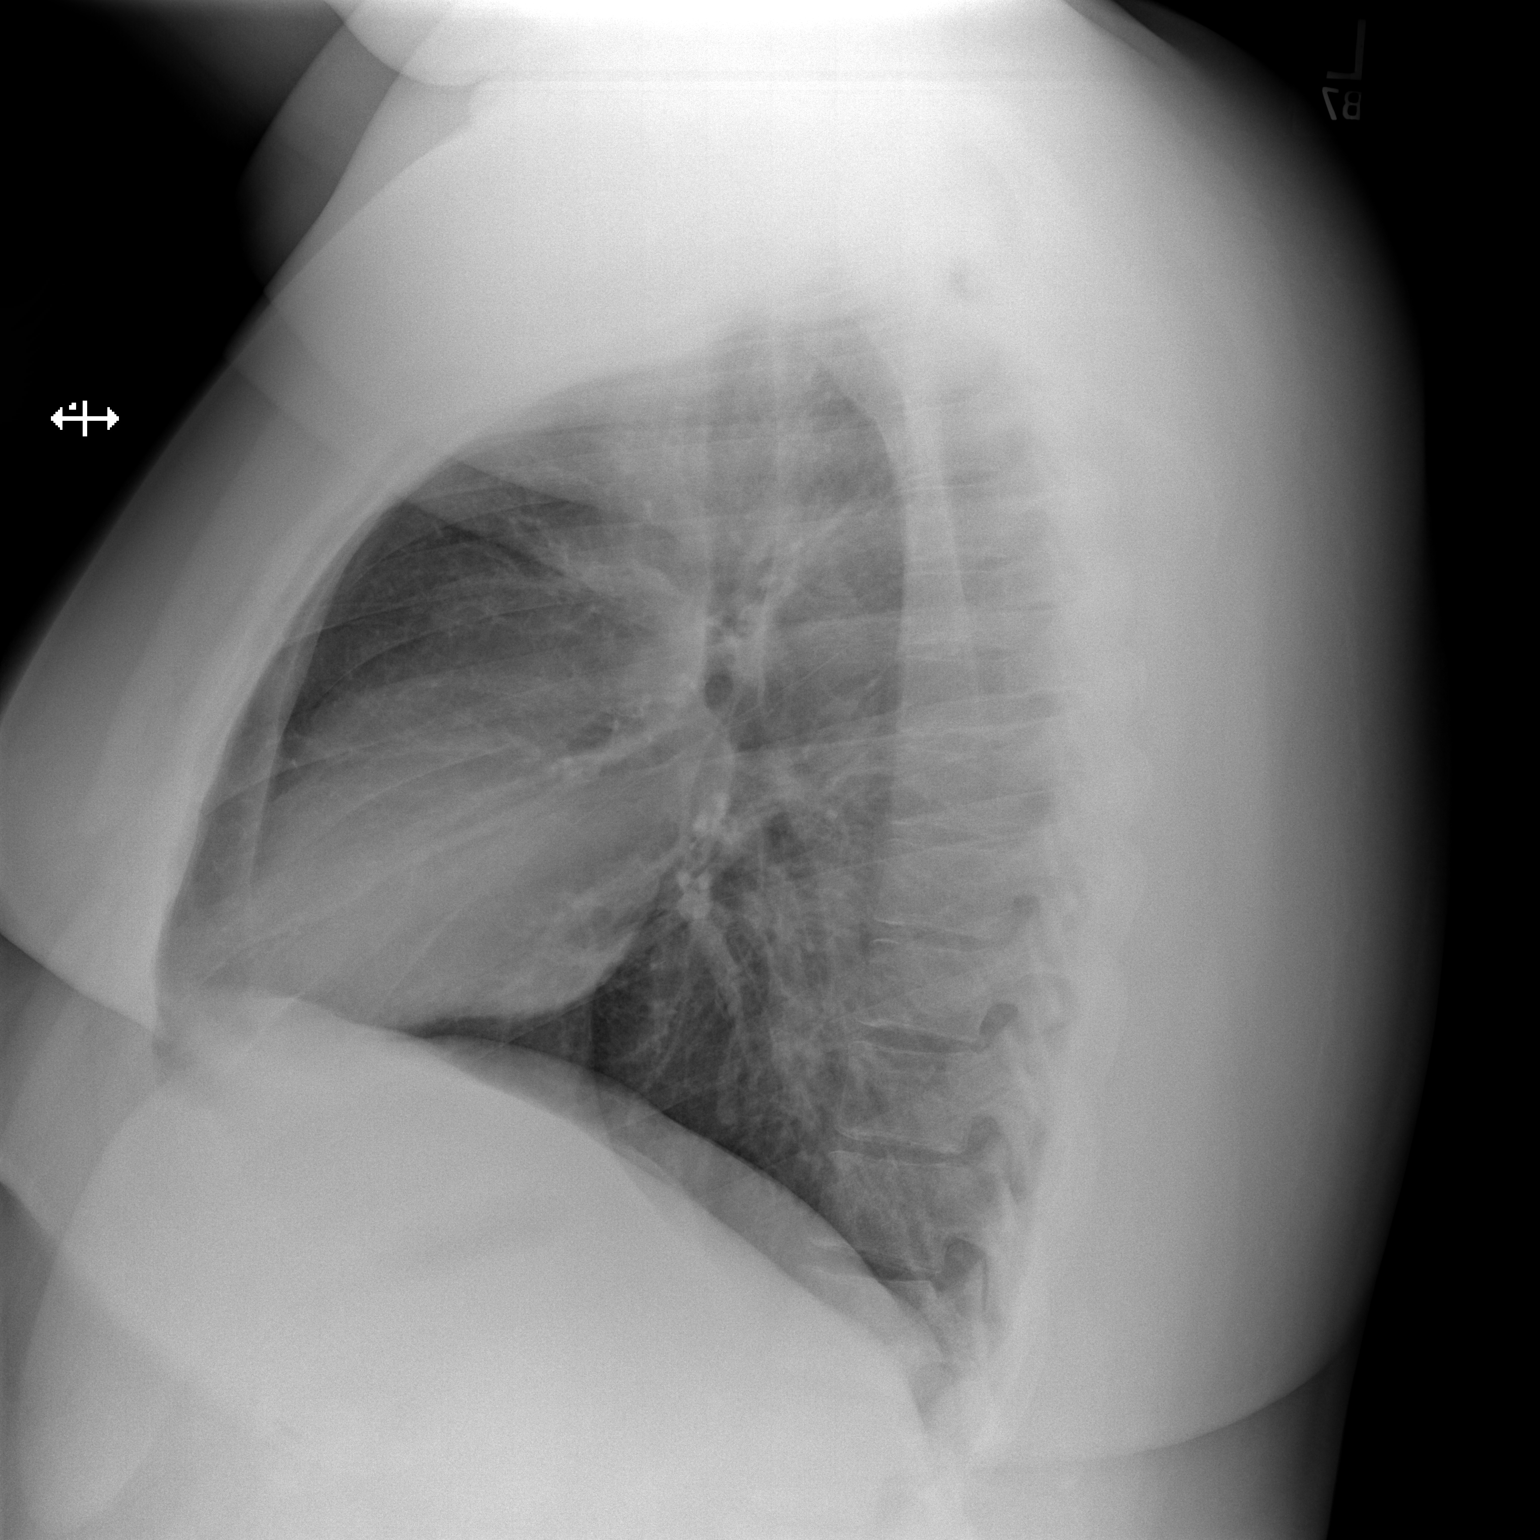

[2 of 2 positions shown; findings below may reference images not displayed]

FINDINGS: Normal mediastinum and cardiac silhouette. Normal pulmonary
vasculature. Mild peribronchial cuffing. No evidence of effusion,
infiltrate, or pneumothorax. No acute bony abnormality.
IMPRESSION: Mild peribronchial cuffing.  No infiltrate.

## 2018-08-12 IMAGING — CR DG CHEST 2V
2 series · 2 of 2 positions shown · non-contrast
Comparison: 08/03/2017

CLINICAL DATA: Patient presents to ED via POV from home with c/o
SOB x 4 days. Hx of asthma; Shielded;

EXAM:
CHEST  2 VIEW

[chest pa]
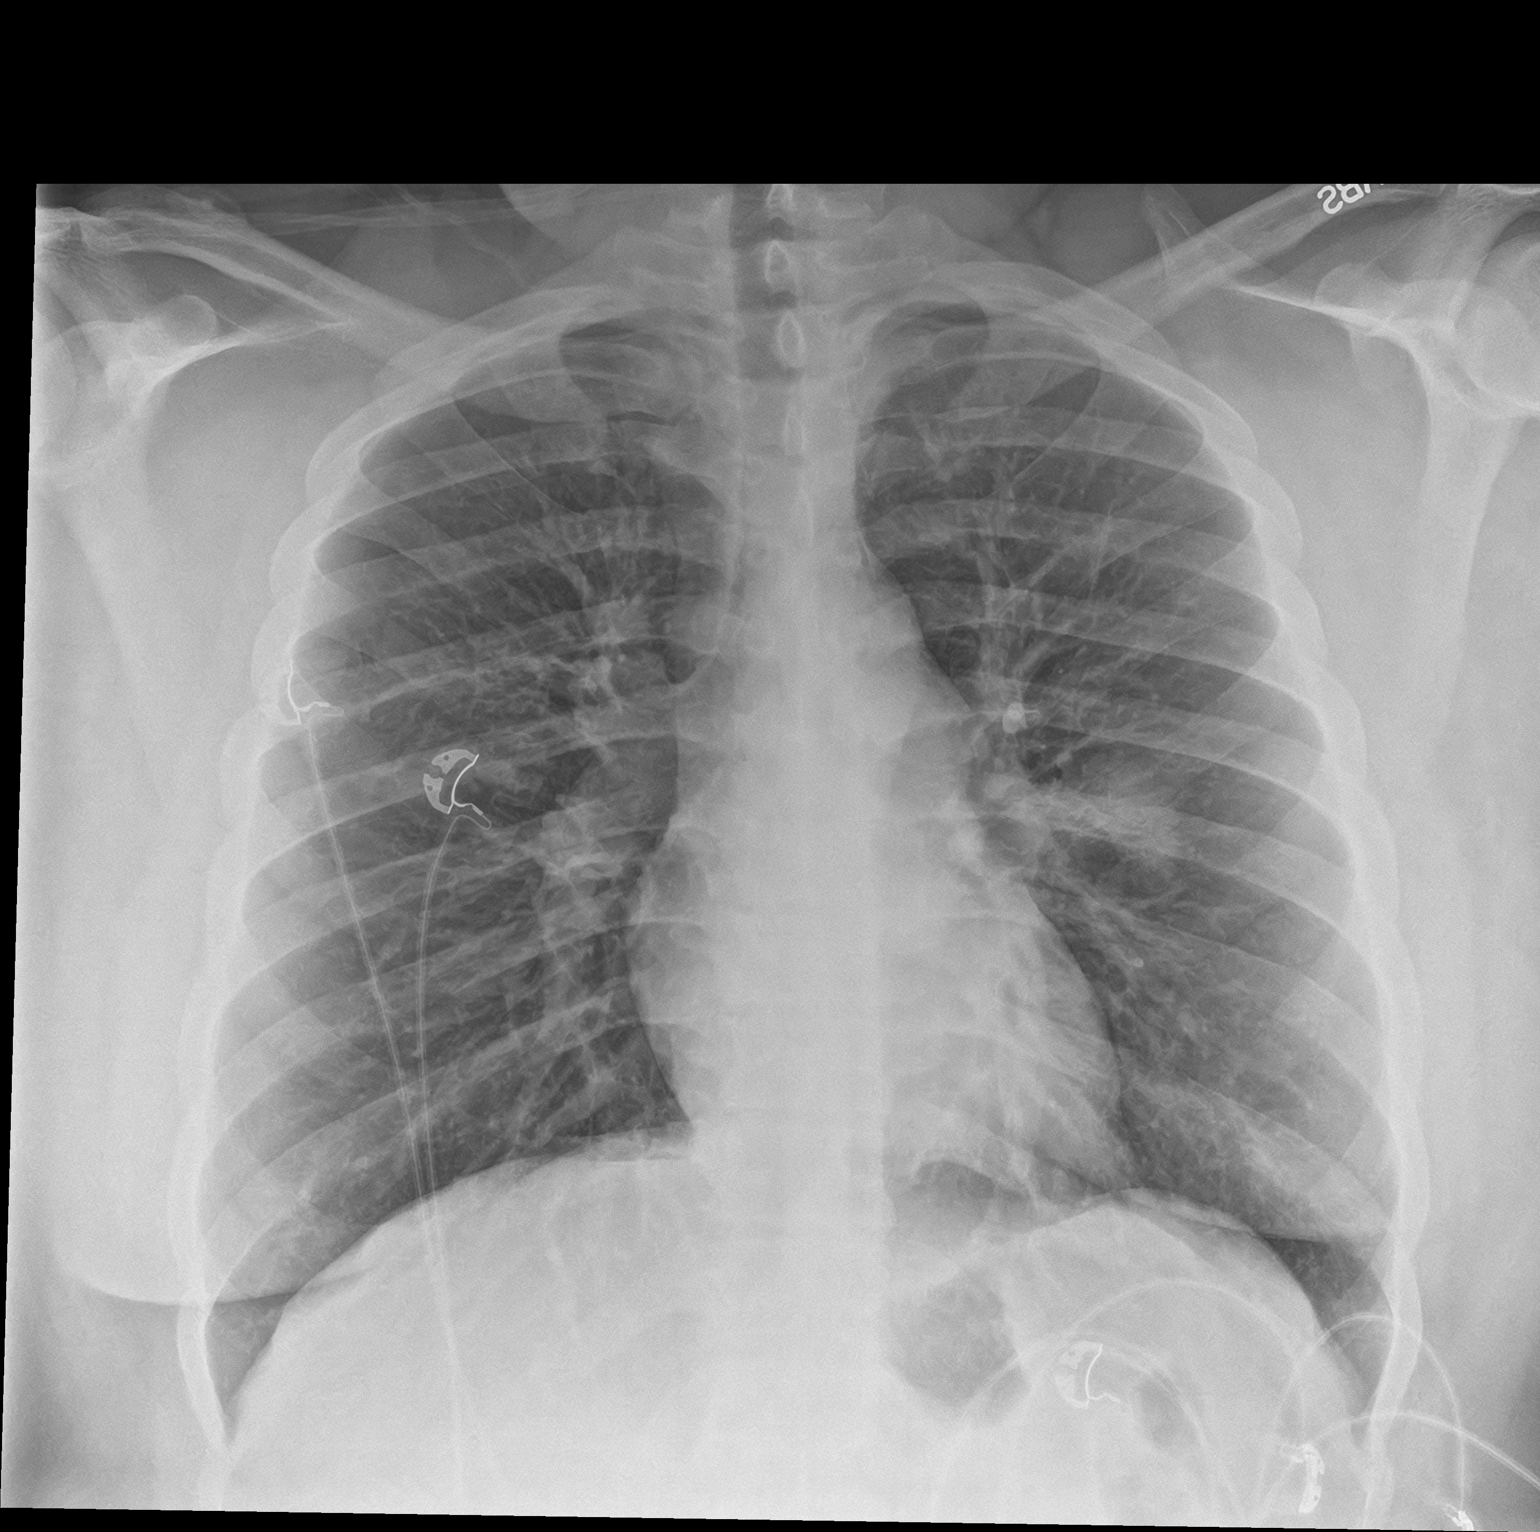

[chest lat]
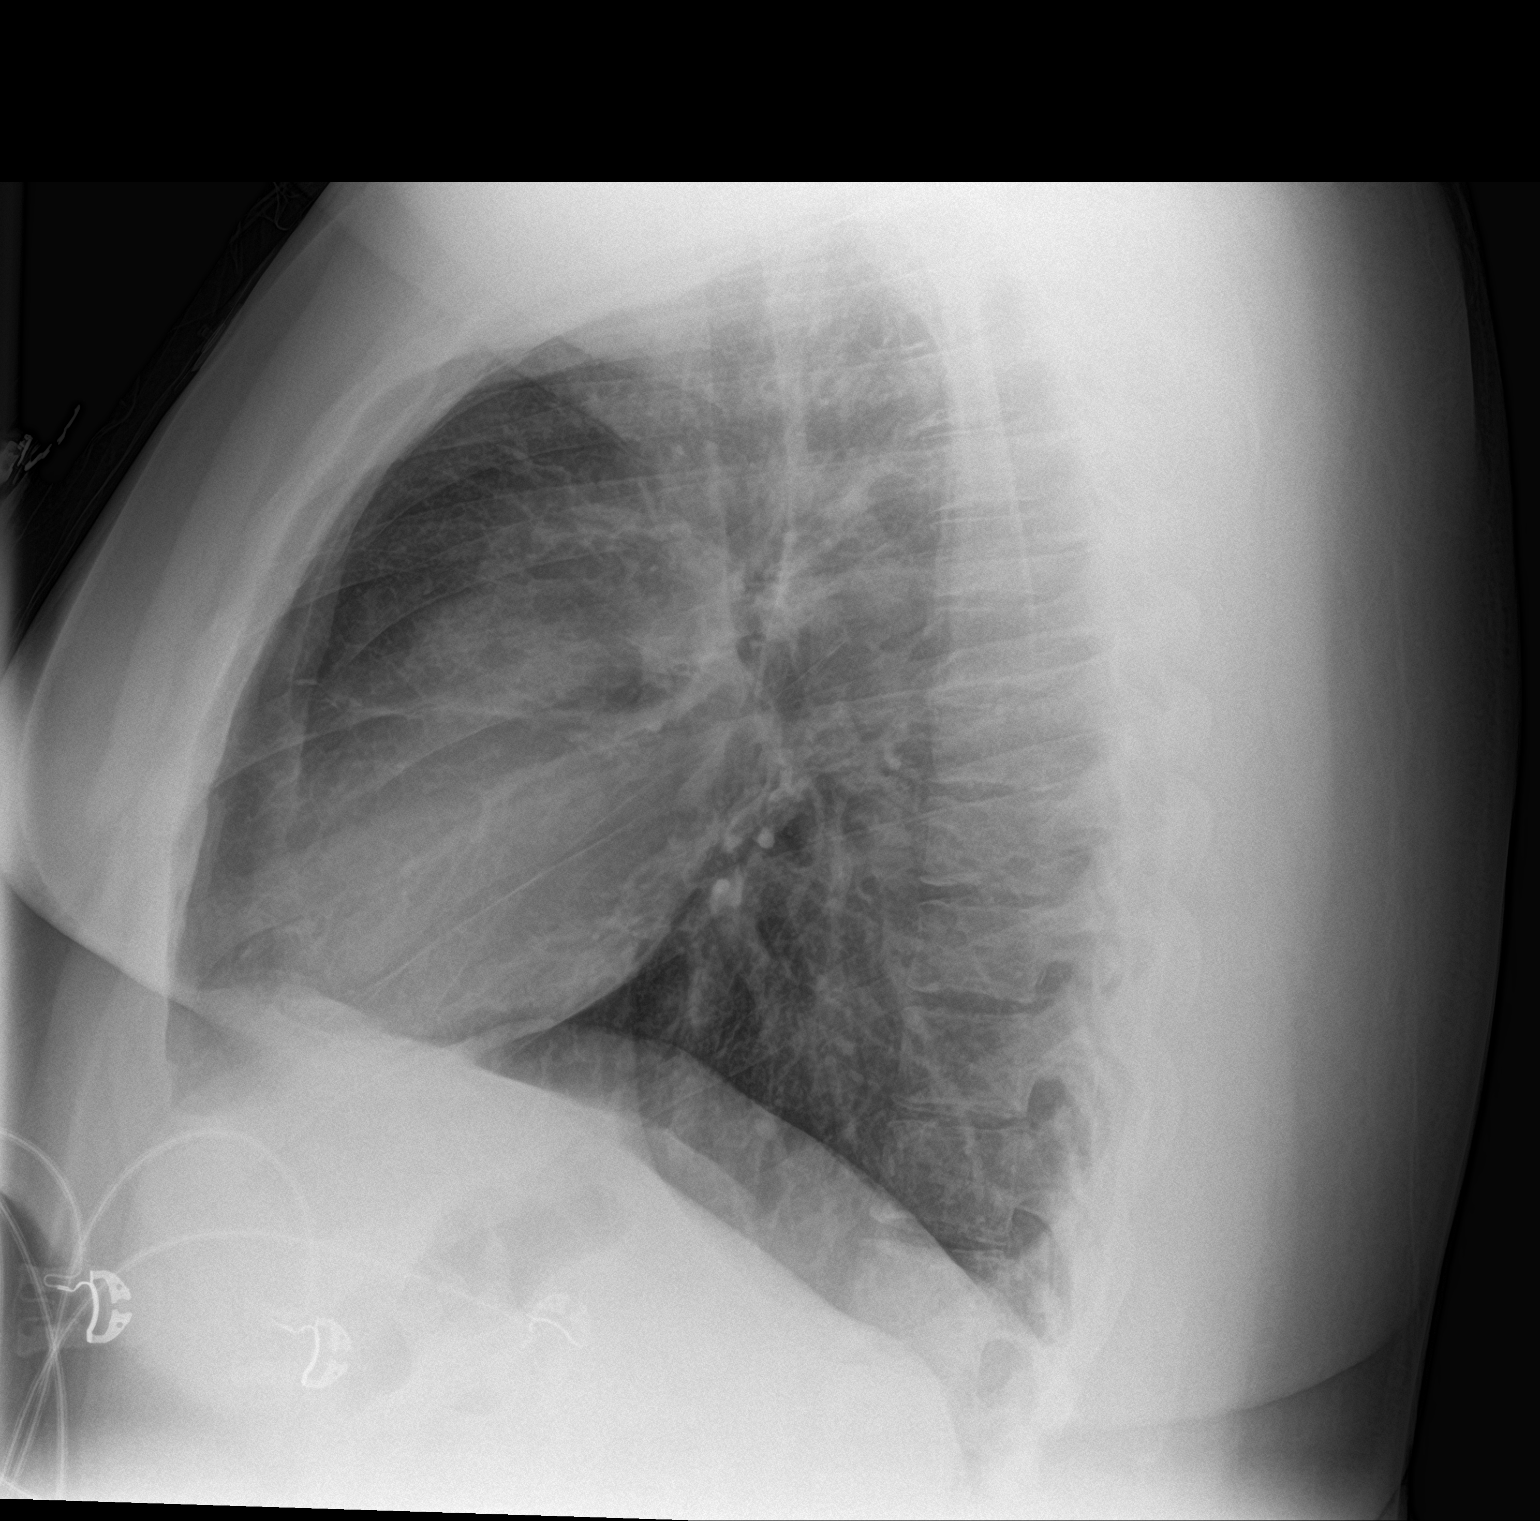

[2 of 2 positions shown; findings below may reference images not displayed]

FINDINGS: Heart size is normal. Lungs are mildly hyperinflated. There is
perihilar peribronchial thickening.

In the anterior left upper lobe region there is an oval opacity
measuring 3.9 centimeters, likely representing infectious or
inflammatory process. Right lung is clear. No pleural effusions or
pulmonary edema.
IMPRESSION: 1. Hyperinflation and bronchitic changes.
2. Left upper lobe opacity favored to be infectious or inflammatory.
However, given the oval appearance, malignancy, atypical infection,
or other process should also be considered. Followup is appropriate.
Followup PA and lateral chest X-ray is recommended in 3-4 weeks
following trial of antibiotic therapy to ensure resolution and
exclude underlying malignancy.

## 2019-12-02 ENCOUNTER — Emergency Department
Admission: EM | Admit: 2019-12-02 | Discharge: 2019-12-02 | Disposition: A | Discharge status: Home / Self Care | Payer: Self-pay | Attending: Emergency Medicine | Admitting: Emergency Medicine

## 2019-12-02 ENCOUNTER — Emergency Department: Payer: Self-pay

## 2019-12-02 ENCOUNTER — Other Ambulatory Visit: Payer: Self-pay

## 2019-12-02 ENCOUNTER — Encounter: Payer: Self-pay | Admitting: Emergency Medicine

## 2019-12-02 DIAGNOSIS — R0789 Other chest pain: Secondary | ICD-10-CM | POA: Insufficient documentation

## 2019-12-02 DIAGNOSIS — Z79899 Other long term (current) drug therapy: Secondary | ICD-10-CM | POA: Insufficient documentation

## 2019-12-02 DIAGNOSIS — J45901 Unspecified asthma with (acute) exacerbation: Secondary | ICD-10-CM | POA: Insufficient documentation

## 2019-12-02 MED ORDER — PREDNISONE 20 MG PO TABS: 60.0000 mg | ORAL_TABLET | Freq: Every day | ORAL | 0 refills | Status: AC

## 2019-12-02 MED ORDER — IPRATROPIUM-ALBUTEROL 0.5-2.5 (3) MG/3ML IN SOLN
3.0000 mL | Freq: Once | RESPIRATORY_TRACT | Status: AC
  Administered 2019-12-02: 09:00:00 3 mL via RESPIRATORY_TRACT
  Filled 2019-12-02: qty 3

## 2019-12-02 MED ORDER — PREDNISONE 20 MG PO TABS
60.0000 mg | ORAL_TABLET | Freq: Once | ORAL | Status: AC
  Administered 2019-12-02: 60 mg via ORAL
  Filled 2019-12-02: qty 3

## 2019-12-02 MED ORDER — ALBUTEROL SULFATE (2.5 MG/3ML) 0.083% IN NEBU: 2.5000 mg | INHALATION_SOLUTION | Freq: Four times a day (QID) | RESPIRATORY_TRACT | 1 refills | Status: DC | PRN

## 2019-12-02 NOTE — ED Triage Notes (Signed)
Pt presents to ED via POV with c/o asthma exascerbation. Pt states took nebulizer PTA without relief. Pt noted to be tachypneic upon arrival. Pt ambulatory without difficulty, however some difficulty noted with speaking in complete sentences.   Pt also c/o generalized CP that he rates as a 6-7/10. Pt states pressure in nature.

## 2019-12-02 NOTE — Discharge Instructions (Signed)
Return to the ER for new, worsening, or persistent severe shortness of breath, chest pain, weakness or lightheadedness, or any other new or worsening symptoms that concern you.

## 2019-12-02 NOTE — ED Notes (Signed)
Pt finished with breathing treatment. Pt states he feels much better since getting the treatment

## 2019-12-02 NOTE — ED Provider Notes (Signed)
[facility] Emergency Department Provider Note ____________________________________________   First MD Initiated Contact with Patient 12/02/19 [phone]     (approximate)  I have reviewed the triage vital signs and the nursing notes.   HISTORY  Chief Complaint Asthma    HPI Edward Jennings is a 24 y.o. male with PMH as noted below who presents with shortness of breath, acute onset about 45 minutes ago, consistent with prior asthma, and associated with some wheezing.  The patient denies cough or fever but has had some pressure-like chest pain since the shortness of breath started.  The patient states she tried to use albuterol at home without relief.  He states his last exacerbation like this requiring him to come to the hospital was around 2 years ago.   Past Medical History:  Diagnosis Date  . Asthma   . Obesity     Patient Active Problem List   Diagnosis Date Noted  . Asthma exacerbation 08/03/2017    History reviewed. No pertinent surgical history.  Prior to Admission medications   Medication Sig Start Date End Date Taking? Authorizing Provider  albuterol (PROVENTIL) (2.5 MG/3ML) 0.083% nebulizer solution Take 3 mLs (2.5 mg total) by nebulization every 6 (six) hours as needed for wheezing or shortness of breath. 12/02/19   Dionne Bucy, MD  Fluticasone-Salmeterol (ADVAIR DISKUS) 250-50 MCG/DOSE AEPB Inhale 1 puff into the lungs 2 (two) times daily. 03/16/18 03/16/19  Emily Filbert, MD  montelukast (SINGULAIR) 10 MG tablet Take 1 tablet (10 mg total) by mouth at bedtime. 03/16/18   Emily Filbert, MD  predniSONE (DELTASONE) 20 MG tablet Take 3 tablets (60 mg total) by mouth daily for 4 days. 12/02/19 12/06/19  Dionne Bucy, MD  Spacer/Aero Chamber Mouthpiece MISC 1 Units by Does not apply route every 4 (four) hours as needed (wheezing). 07/21/17   Merrily Brittle, MD    Allergies Patient has no known allergies.  History reviewed. No  pertinent family history.  Social History Social History   Tobacco Use  . Smoking status: Never Smoker  . Smokeless tobacco: Never Used  Substance Use Topics  . Alcohol use: Yes  . Drug use: No    Review of Systems  Constitutional: No fever/chills. Eyes: No redness. ENT: No sore throat. Cardiovascular: Positive for chest pain. Respiratory: Positive for shortness of breath. Gastrointestinal: No vomiting or diarrhea.  Genitourinary: Negative for flank pain.  Musculoskeletal: Negative for back pain. Skin: Negative for rash. Neurological: Negative for headache.   ____________________________________________   PHYSICAL EXAM:  VITAL SIGNS: ED Triage Vitals  Enc Vitals Group     BP 12/02/19 0832 (!) 145/39     Pulse Rate 12/02/19 0832 95     Resp 12/02/19 0832 (!) 28     Temp 12/02/19 0832 99.2 F (37.3 C)     Temp Source 12/02/19 0832 Oral     SpO2 12/02/19 0832 96 %     Weight 12/02/19 0833 300 lb (136.1 kg)     Height 12/02/19 0833 5\' 9"  (1.753 m)     Head Circumference --      Peak Flow --      Pain Score 12/02/19 0832 7     Pain Loc --      Pain Edu? --      Excl. in [place]? --     Constitutional: Alert and oriented.  Slightly uncomfortable appearing but in no acute distress. Eyes: Conjunctivae are normal.  Head: Atraumatic. Nose: No congestion/rhinnorhea. Mouth/Throat:  Mucous membranes are moist.   Neck: Normal range of motion.  Cardiovascular: Normal rate, regular rhythm. Grossly normal heart sounds.  Good peripheral circulation. Respiratory: Increased respiratory effort.  No retractions.  Diffuse wheezing bilaterally. Gastrointestinal: No distention.  Musculoskeletal:  Extremities warm and well perfused.  Neurologic:  Normal speech and language. No gross focal neurologic deficits are appreciated.  Skin:  Skin is warm and dry. No rash noted. Psychiatric: Mood and affect are normal. Speech and behavior are  normal.  ____________________________________________   LABS (all labs ordered are listed, but only abnormal results are displayed)  Labs Reviewed - No data to display ____________________________________________  EKG  ED ECG REPORT I, Arta Silence, the attending physician, personally viewed and interpreted this ECG.  Date: 12/02/2019 EKG Time: 0834 Rate: 89 Rhythm: normal sinus rhythm QRS Axis: normal Intervals: normal ST/T Wave abnormalities: normal Narrative Interpretation: no evidence of acute ischemia  ____________________________________________  RADIOLOGY  CXR: No focal infiltrate or other acute abnormality  ____________________________________________   PROCEDURES  Procedure(s) performed: No  Procedures  Critical Care performed: No ____________________________________________   INITIAL IMPRESSION / ASSESSMENT AND PLAN / ED COURSE  Pertinent labs & imaging results that were available during my care of the patient were reviewed by me and considered in my medical decision making (see chart for details).  24 year old male with history of asthma presents with acute onset of shortness of breath for around 45 minutes, not relieved by his albuterol at home.  The patient has also had some chest pain over the same.[name]  He denies cough or fever and states he was feeling fine until this morning.  On exam, the patient is slightly uncomfortable appearing and borderline tachypneic but in no acute distress.  He is speaking in full sentences.  He does have diffuse wheezing bilaterally but his O2 saturation is in the mid to high 90s on room air.  Overall presentation is consistent with an asthma exacerbation.  We will give bronchodilators and steroid.  Differential also includes acute bronchitis, pneumonia, COVID-19.  Although the patient has chest pain, given that it started when the shortness of breath and wheezing started and he is young with no ACS risk factors and a  normal EKG, I do not suspect ACS and there is no indication for lab work-up at this time.  ----------------------------------------- 10:33 AM on 12/02/2019 -----------------------------------------  Patient states he feels much better after 2 DuoNeb's and prednisone.  He continues to have an O2 saturation in the mid 90s on room air but no increased work of breathing and appears well.  He is stable for discharge home.  I will prescribe prednisone as well as some additional butyryl for his nebulizer machine.  Return precautions given, and he expresses understanding.  ____________________________________________   FINAL CLINICAL IMPRESSION(S) / ED DIAGNOSES  Final diagnoses:  Exacerbation of asthma, unspecified asthma severity, unspecified whether persistent      NEW MEDICATIONS STARTED DURING THIS VISIT:  New Prescriptions   ALBUTEROL (PROVENTIL) (2.5 MG/3ML) 0.083% NEBULIZER SOLUTION    Take 3 mLs (2.5 mg total) by nebulization every 6 (six) hours as needed for wheezing or shortness of breath.   PREDNISONE (DELTASONE) 20 MG TABLET    Take 3 tablets (60 mg total) by mouth daily for 4 days.     Note:  This document was prepared using Dragon voice recognition software and may include unintentional dictation errors.    Arta Silence, MD 12/02/19 [phone]

## 2019-12-27 ENCOUNTER — Emergency Department
Admission: EM | Admit: 2019-12-27 | Discharge: 2019-12-27 | Disposition: A | Discharge status: Home / Self Care | Payer: Self-pay | Attending: Emergency Medicine | Admitting: Emergency Medicine

## 2019-12-27 ENCOUNTER — Other Ambulatory Visit: Payer: Self-pay

## 2019-12-27 DIAGNOSIS — M5442 Lumbago with sciatica, left side: Secondary | ICD-10-CM | POA: Insufficient documentation

## 2019-12-27 DIAGNOSIS — M5441 Lumbago with sciatica, right side: Secondary | ICD-10-CM | POA: Insufficient documentation

## 2019-12-27 LAB — URINALYSIS, COMPLETE (UACMP) WITH MICROSCOPIC
Bacteria, UA: NONE SEEN
Bilirubin Urine: NEGATIVE
Glucose, UA: NEGATIVE mg/dL
Hgb urine dipstick: NEGATIVE
Ketones, ur: NEGATIVE mg/dL
Leukocytes,Ua: NEGATIVE
Nitrite: NEGATIVE
Protein, ur: 30 mg/dL — AB
Specific Gravity, Urine: 1.033 — ABNORMAL HIGH (ref 1.005–1.030)
Squamous Epithelial / HPF: NONE SEEN (ref 0–5)
pH: 5 (ref 5.0–8.0)

## 2019-12-27 MED ORDER — METHOCARBAMOL 500 MG PO TABS: 500.0000 mg | ORAL_TABLET | Freq: Three times a day (TID) | ORAL | 0 refills | Status: AC | PRN

## 2019-12-27 MED ORDER — KETOROLAC TROMETHAMINE 30 MG/ML IJ SOLN
30.0000 mg | Freq: Once | INTRAMUSCULAR | Status: AC
  Administered 2019-12-27: 19:00:00 30 mg via INTRAMUSCULAR
  Filled 2019-12-27: qty 1

## 2019-12-27 MED ORDER — KETOROLAC TROMETHAMINE 10 MG PO TABS: 10.0000 mg | ORAL_TABLET | Freq: Four times a day (QID) | ORAL | 0 refills | Status: AC | PRN

## 2019-12-27 NOTE — ED Triage Notes (Signed)
Pt to the er for low back pain x 2 weeks with no improvement. Pt taking tylenol at home with no relief. Denies injury.

## 2019-12-27 NOTE — ED Provider Notes (Signed)
Emergency Department Provider Note  ____________________________________________  Time seen: Approximately 9:58 PM  I have reviewed the triage vital signs and the nursing notes.   HISTORY  Chief Complaint Back Pain   Historian Patient     HPI Edward Jennings is a 24 y.o. male presents to the emergency department with bilateral low back pain with bilateral lower extremity radiculopathy.  Patient states that he has had low back pain for approximately 2 weeks.  He does engage in heavy lifting at home and at work.  No falls or mechanisms of trauma.  He denies bowel or bladder incontinence or saddle anesthesia.  No fever.  He denies dysuria, hematuria or increased urinary frequency.  No other alleviating measures have been attempted.   Past Medical History:  Diagnosis Date  . Asthma   . Obesity      Immunizations up to date:  Yes.     Past Medical History:  Diagnosis Date  . Asthma   . Obesity     Patient Active Problem List   Diagnosis Date Noted  . Asthma exacerbation 08/03/2017    History reviewed. No pertinent surgical history.  Prior to Admission medications   Medication Sig Start Date End Date Taking? Authorizing Provider  albuterol (PROVENTIL) (2.5 MG/3ML) 0.083% nebulizer solution Take 3 mLs (2.5 mg total) by nebulization every 6 (six) hours as needed for wheezing or shortness of breath. 12/02/19   Dionne Bucy, MD  Fluticasone-Salmeterol (ADVAIR DISKUS) 250-50 MCG/DOSE AEPB Inhale 1 puff into the lungs 2 (two) times daily. 03/16/18 03/16/19  Emily Filbert, MD  ketorolac (TORADOL) 10 MG tablet Take 1 tablet (10 mg total) by mouth every 6 (six) hours as needed for up to 5 days. 12/27/19 01/01/20  Orvil Feil, PA-C  methocarbamol (ROBAXIN) 500 MG tablet Take 1 tablet (500 mg total) by mouth every 8 (eight) hours as needed for up to 5 days. 12/27/19 01/01/20  Pia Mau M, PA-C  montelukast (SINGULAIR) 10 MG tablet Take 1 tablet (10 mg total) by mouth at  bedtime. 03/16/18   Emily Filbert, MD  Spacer/Aero Chamber Mouthpiece MISC 1 Units by Does not apply route every 4 (four) hours as needed (wheezing). 07/21/17   Merrily Brittle, MD    Allergies Patient has no known allergies.  No family history on file.  Social History Social History   Tobacco Use  . Smoking status: Never Smoker  . Smokeless tobacco: Never Used  Substance Use Topics  . Alcohol use: Yes    Comment: social  . Drug use: No     Review of Systems  Constitutional: No fever/chills Eyes:  No discharge ENT: No upper respiratory complaints. Respiratory: no cough. No SOB/ use of accessory muscles to breath Gastrointestinal:   No nausea, no vomiting.  No diarrhea.  No constipation. Musculoskeletal: Patient has low back pain.  Skin: Negative for rash, abrasions, lacerations, ecchymosis.    ____________________________________________   PHYSICAL EXAM:  VITAL SIGNS: ED Triage Vitals  Enc Vitals Group     BP 12/27/19 1800 (!) 145/86     Pulse Rate 12/27/19 1800 (!) 120     Resp 12/27/19 1800 18     Temp 12/27/19 1800 98.7 F (37.1 C)     Temp Source 12/27/19 1800 Oral     SpO2 --      Weight 12/27/19 1802 (!) 315 lb (142.9 kg)     Height 12/27/19 1802 5\' 9"  (1.753 m)     Head Circumference --  Peak Flow --      Pain Score 12/27/19 1802 10     Pain Loc --      Pain Edu? --      Excl. in [place]? --      Constitutional: Alert and oriented. Well appearing and in no acute distress. Eyes: Conjunctivae are normal. PERRL. EOMI. Head: Atraumatic. Cardiovascular: Normal rate, regular rhythm. Normal S1 and S2.  Good peripheral circulation. Respiratory: Normal respiratory effort without tachypnea or retractions. Lungs CTAB. Good air entry to the bases with no decreased or absent breath sounds Gastrointestinal: Bowel sounds x 4 quadrants. Soft and nontender to palpation. No guarding or rigidity. No distention. Musculoskeletal: Full range of motion to all  extremities. No obvious deformities noted.  Patient has paraspinal muscle tenderness along the lumbar spine.  No midline lumbar spine tenderness. Neurologic:  Normal for age. No gross focal neurologic deficits are appreciated.  Skin:  Skin is warm, dry and intact. No rash noted. Psychiatric: Mood and affect are normal for age. Speech and behavior are normal.   ____________________________________________   LABS (all labs ordered are listed, but only abnormal results are displayed)  Labs Reviewed  URINALYSIS, COMPLETE (UACMP) WITH MICROSCOPIC - Abnormal; Notable for the following components:      Result Value   Color, Urine AMBER (*)    APPearance CLEAR (*)    Specific Gravity, Urine 1.033 (*)    Protein, ur 30 (*)    All other components within normal limits   ____________________________________________  EKG   ____________________________________________  RADIOLOGY   No results found.  ____________________________________________    PROCEDURES  Procedure(s) performed:     Procedures     Medications  ketorolac (TORADOL) 30 MG/ML injection 30 mg (30 mg Intramuscular Given 12/27/19 1846)     ____________________________________________   INITIAL IMPRESSION / ASSESSMENT AND PLAN / ED COURSE  Pertinent labs & imaging results that were available during my care of the patient were reviewed by me and considered in my medical decision making (see chart for details).      Assessment and plan Low back pain 25 year old male presents to the emergency department with low back pain for the past 2 weeks.  Patient was resting comfortably in exam room.  He had some paraspinal muscle tenderness along the lumbar spine.  Differential diagnosis included lumbar strain, sciatica, cystitis...  Urinalysis was noncontributory for cystitis.  Lumbar strain is likely at this time.  Patient was discharged with Toradol and Robaxin.  Return precautions were given to return with new  or worsening symptoms.  All patient questions were answered.   ____________________________________________  FINAL CLINICAL IMPRESSION(S) / ED DIAGNOSES  Final diagnoses:  Acute bilateral low back pain with bilateral sciatica      NEW MEDICATIONS STARTED DURING THIS VISIT:  ED Discharge Orders         Ordered    ketorolac (TORADOL) 10 MG tablet  Every 6 hours PRN     12/27/19 1941    methocarbamol (ROBAXIN) 500 MG tablet  Every 8 hours PRN     12/27/19 1941              This chart was dictated using voice recognition software/Dragon. Despite best efforts to proofread, errors can occur which can change the meaning. Any change was purely unintentional.     Karren Cobble 12/27/19 2202    Blake Divine, MD 12/28/19 [phone]

## 2021-02-25 ENCOUNTER — Emergency Department: Payer: Self-pay

## 2021-02-25 ENCOUNTER — Other Ambulatory Visit: Payer: Self-pay

## 2021-02-25 ENCOUNTER — Encounter: Payer: Self-pay | Admitting: Emergency Medicine

## 2021-02-25 ENCOUNTER — Emergency Department
Admission: EM | Admit: 2021-02-25 | Discharge: 2021-02-25 | Disposition: A | Discharge status: Home / Self Care | Payer: Self-pay | Attending: Emergency Medicine | Admitting: Emergency Medicine

## 2021-02-25 DIAGNOSIS — R0602 Shortness of breath: Secondary | ICD-10-CM

## 2021-02-25 DIAGNOSIS — R059 Cough, unspecified: Secondary | ICD-10-CM

## 2021-02-25 DIAGNOSIS — J4541 Moderate persistent asthma with (acute) exacerbation: Secondary | ICD-10-CM

## 2021-02-25 DIAGNOSIS — Z7952 Long term (current) use of systemic steroids: Secondary | ICD-10-CM | POA: Insufficient documentation

## 2021-02-25 MED ORDER — FLUTICASONE-SALMETEROL 250-50 MCG/ACT IN AEPB: 1.0000 | INHALATION_SPRAY | Freq: Two times a day (BID) | RESPIRATORY_TRACT | 1 refills | Status: DC

## 2021-02-25 MED ORDER — PREDNISONE 50 MG PO TABS: 50.0000 mg | ORAL_TABLET | Freq: Every day | ORAL | 0 refills | Status: DC

## 2021-02-25 MED ORDER — PREDNISONE 20 MG PO TABS
60.0000 mg | ORAL_TABLET | Freq: Once | ORAL | Status: AC
  Administered 2021-02-25: 60 mg via ORAL
  Filled 2021-02-25: qty 3

## 2021-02-25 MED ORDER — IPRATROPIUM-ALBUTEROL 0.5-2.5 (3) MG/3ML IN SOLN
6.0000 mL | Freq: Once | RESPIRATORY_TRACT | Status: AC
  Administered 2021-02-25: 6 mL via RESPIRATORY_TRACT
  Filled 2021-02-25: qty 6

## 2021-02-25 NOTE — ED Notes (Signed)
Vital signs stable. 

## 2021-02-25 NOTE — Discharge Instructions (Signed)
You are being discharged with 2 prescriptions.  Advair to continue taking twice daily every day. Prednisone steroids to take once daily starting tomorrow (Thursday, May 5) to take once daily for the next 4 days.  Continue your rescue inhaler albuterol every 4 hours as needed.  If you develop any further worsening symptoms despite the medication, please return to the ED.

## 2021-02-25 NOTE — ED Triage Notes (Addendum)
Pt arrived via POV with reports of shortness of breath, pt states he has been out of his Advair inhaler for the 1-2 weeks, using rescue inhaler but no relief. Audible wheezing noted in triage. Short of breath when speaking in long sentences

## 2021-02-25 NOTE — ED Provider Notes (Signed)
[facility] Emergency Department Provider Note ____________________________________________   Event Date/Time   First MD Initiated Contact with Patient 02/25/21 [phone]     (approximate)  I have reviewed the triage vital signs and the nursing notes.  HISTORY  Chief Complaint Asthma   HPI Edward Jennings is a 25 y.o. malewho presents to the ED for evaluation of shortness of breath.   Chart review indicates hx moderate persistent asthma on Advair controller. Obesity.   Pt reports running out of his Advair in the past week or two, but still has albuterol rescue inhaler.  He reports increasing shortness of breath over the past couple days, primarily worsening over the past 5 or 6 hours.  Reports nonproductive cough without fever, chest pain, emesis or abdominal pain.   Past Medical History:  Diagnosis Date  . Asthma   . Obesity     Patient Active Problem List   Diagnosis Date Noted  . Asthma exacerbation 08/03/2017    History reviewed. No pertinent surgical history.  Prior to Admission medications   Medication Sig Start Date End Date Taking? Authorizing Provider  fluticasone-salmeterol (ADVAIR DISKUS) 250-50 MCG/ACT AEPB Inhale 1 puff into the lungs in the morning and at bedtime. 02/25/21  Yes Delton Prairie, MD  predniSONE (DELTASONE) 50 MG tablet Take 1 tablet (50 mg total) by mouth daily. 02/25/21  Yes Delton Prairie, MD  albuterol (PROVENTIL) (2.5 MG/3ML) 0.083% nebulizer solution Take 3 mLs (2.5 mg total) by nebulization every 6 (six) hours as needed for wheezing or shortness of breath. 12/02/19   Dionne Bucy, MD  Fluticasone-Salmeterol (ADVAIR DISKUS) 250-50 MCG/DOSE AEPB Inhale 1 puff into the lungs 2 (two) times daily. 03/16/18 03/16/19  Emily Filbert, MD  montelukast (SINGULAIR) 10 MG tablet Take 1 tablet (10 mg total) by mouth at bedtime. 03/16/18   Emily Filbert, MD  Spacer/Aero Chamber Mouthpiece MISC 1 Units by Does not apply route every  4 (four) hours as needed (wheezing). 07/21/17   Merrily Brittle, MD    Allergies Patient has no known allergies.  History reviewed. No pertinent family history.  Social History Social History   Tobacco Use  . Smoking status: Never Smoker  . Smokeless tobacco: Never Used  Vaping Use  . Vaping Use: Never used  Substance Use Topics  . Alcohol use: Yes    Comment: social  . Drug use: No    Review of Systems  Constitutional: No fever/chills Eyes: No visual changes. ENT: No sore throat. Cardiovascular: Denies chest pain. Respiratory: Positive for shortness of breath and cough Gastrointestinal: No abdominal pain.  No nausea, no vomiting.  No diarrhea.  No constipation. Genitourinary: Negative for dysuria. Musculoskeletal: Negative for back pain. Skin: Negative for rash. Neurological: Negative for headaches, focal weakness or numbness.  ____________________________________________   PHYSICAL EXAM:  VITAL SIGNS: Vitals:   02/25/21 0530 02/25/21 0600  BP: 135/86 (!) 157/84  Pulse: (!) 109 (!) 101  Resp: (!) 44 (!) 21  Temp:    SpO2: 99% 92%     Constitutional: Alert and oriented.  Obese, sitting upright in bed and with audible wheezing.  Speaking in phrases only Eyes: Conjunctivae are normal. PERRL. EOMI. Head: Atraumatic. Nose: No congestion/rhinnorhea. Mouth/Throat: Mucous membranes are moist.  Oropharynx non-erythematous. Neck: No stridor. No cervical spine tenderness to palpation. Cardiovascular: Tachycardic rate, regular rhythm. Grossly normal heart sounds.  Good peripheral circulation. Respiratory: Tachypneic to the mid/upper 20s.  No retractions.  Diffuse expiratory wheezes, prolonged expiratory phase and slight  decrease in air movement throughout Gastrointestinal: Soft , nondistended, nontender to palpation. No CVA tenderness. Musculoskeletal: No lower extremity tenderness nor edema.  No joint effusions. No signs of acute trauma. Neurologic:  Normal speech and  language. No gross focal neurologic deficits are appreciated. No gait instability noted. Skin:  Skin is warm, dry and intact. No rash noted. Psychiatric: Mood and affect are normal. Speech and behavior are normal.  ____________________________________________   LABS (all labs ordered are listed, but only abnormal results are displayed)  Labs Reviewed - No data to display ____________________________________________  12 Lead EKG Sinus rhythm, rate of 99 bpm.  Normal axis and intervals.  No evidence of acute ischemia.  ____________________________________________  RADIOLOGY  ED MD interpretation: 1 view CXR reviewed by me without evidence of acute cardiopulmonary pathology.  Official radiology report(s): DG Chest Portable 1 View  Result Date: 02/25/2021 CLINICAL DATA:  Asthma exacerbation. EXAM: PORTABLE CHEST 1 VIEW COMPARISON:  12/02/2019. FINDINGS: Mediastinum hilar structures normal. Heart size normal. Low lung volumes with mild bibasilar subsegmental atelectasis. No focal infiltrate. No pleural effusion or pneumothorax. IMPRESSION: Low lung volumes with mild bibasilar subsegmental atelectasis. No acute cardiopulmonary disease identified. Electronically Signed   By: Maisie Fus  Register   On: 02/25/2021 05:28    ____________________________________________   PROCEDURES and INTERVENTIONS  Procedure(s) performed (including Critical Care):  .1-3 Lead EKG Interpretation Performed by: Delton Prairie, MD Authorized by: Delton Prairie, MD     Interpretation: abnormal     ECG rate:  103   ECG rate assessment: tachycardic     Rhythm: sinus tachycardia     Ectopy: none     Conduction: normal      Medications  predniSONE (DELTASONE) tablet 60 mg (60 mg Oral Given 02/25/21 0503)  ipratropium-albuterol (DUONEB) 0.5-2.5 (3) MG/3ML nebulizer solution 6 mL (6 mLs Nebulization Given 02/25/21 0504)    ____________________________________________   MDM / ED COURSE   25 year old male with  history of moderate persistent asthma presents to the ED with an acute exacerbation after running out of his home Advair, ultimately amenable to outpatient management.  Presents tachycardic and tachypneic, sitting upright in bed with audible wheezing and evidence of asthma exacerbation.  No indication for BiPAP and CXR demonstrates no infiltrates or PTX.  EKG is nonischemic.  Patient with resolving symptoms after breathing treatment and steroids, most clinically much improved.  Provided prescription for his home Advair as well as a steroid burst.  Return precautions for the ED were discussed prior to discharge.   Clinical Course as of 02/25/21 0707  Wed Feb 25, 2021  0556 Reassessed.  Patient looks much improved.  He reports feeling better.  He has improved airflow on auscultation but still some persistent and scattered expiratory wheezes.  We discussed additional breathing treatments versus go ahead and discharge him, and he reports desire for discharge at this time.  We discussed prednisone and Advair prescription at the pharmacy for him.  We discussed return precautions [DS]    Clinical Course User Index [DS] Delton Prairie, MD    ____________________________________________   FINAL CLINICAL IMPRESSION(S) / ED DIAGNOSES  Final diagnoses:  Moderate persistent asthma with exacerbation  Shortness of breath  Cough     ED Discharge Orders         Ordered    fluticasone-salmeterol (ADVAIR DISKUS) 250-50 MCG/ACT AEPB  2 times daily        02/25/21 0456    predniSONE (DELTASONE) 50 MG tablet  Daily  02/25/21 0600           Keila Turan Katrinka Blazing   Note:  This document was prepared using Dragon voice recognition software and may include unintentional dictation errors.   Delton Prairie, MD 02/25/21 [phone]

## 2021-06-14 ENCOUNTER — Inpatient Hospital Stay: Payer: Self-pay

## 2021-06-14 ENCOUNTER — Emergency Department: Payer: Self-pay

## 2021-06-14 ENCOUNTER — Inpatient Hospital Stay
Admission: EM | Admit: 2021-06-14 | Discharge: 2021-06-25 | DRG: 208 | Disposition: E | Payer: Self-pay | Attending: Pulmonary Disease | Admitting: Pulmonary Disease

## 2021-06-14 DIAGNOSIS — J4551 Severe persistent asthma with (acute) exacerbation: Principal | ICD-10-CM | POA: Diagnosis present

## 2021-06-14 DIAGNOSIS — G931 Anoxic brain damage, not elsewhere classified: Secondary | ICD-10-CM | POA: Diagnosis not present

## 2021-06-14 DIAGNOSIS — Z7951 Long term (current) use of inhaled steroids: Secondary | ICD-10-CM

## 2021-06-14 DIAGNOSIS — G935 Compression of brain: Secondary | ICD-10-CM | POA: Diagnosis not present

## 2021-06-14 DIAGNOSIS — G936 Cerebral edema: Secondary | ICD-10-CM | POA: Diagnosis not present

## 2021-06-14 DIAGNOSIS — I255 Ischemic cardiomyopathy: Secondary | ICD-10-CM | POA: Diagnosis present

## 2021-06-14 DIAGNOSIS — Z452 Encounter for adjustment and management of vascular access device: Secondary | ICD-10-CM

## 2021-06-14 DIAGNOSIS — Z6841 Body Mass Index (BMI) 40.0 and over, adult: Secondary | ICD-10-CM

## 2021-06-14 DIAGNOSIS — E875 Hyperkalemia: Secondary | ICD-10-CM | POA: Diagnosis present

## 2021-06-14 DIAGNOSIS — Z66 Do not resuscitate: Secondary | ICD-10-CM | POA: Diagnosis not present

## 2021-06-14 DIAGNOSIS — R570 Cardiogenic shock: Secondary | ICD-10-CM | POA: Diagnosis present

## 2021-06-14 DIAGNOSIS — R000 Tachycardia, unspecified: Secondary | ICD-10-CM | POA: Diagnosis present

## 2021-06-14 DIAGNOSIS — R7401 Elevation of levels of liver transaminase levels: Secondary | ICD-10-CM

## 2021-06-14 DIAGNOSIS — J690 Pneumonitis due to inhalation of food and vomit: Secondary | ICD-10-CM | POA: Diagnosis present

## 2021-06-14 DIAGNOSIS — Z20822 Contact with and (suspected) exposure to covid-19: Secondary | ICD-10-CM | POA: Diagnosis present

## 2021-06-14 DIAGNOSIS — J9602 Acute respiratory failure with hypercapnia: Secondary | ICD-10-CM

## 2021-06-14 DIAGNOSIS — E872 Acidosis: Secondary | ICD-10-CM | POA: Diagnosis present

## 2021-06-14 DIAGNOSIS — Z9911 Dependence on respirator [ventilator] status: Secondary | ICD-10-CM

## 2021-06-14 DIAGNOSIS — E874 Mixed disorder of acid-base balance: Secondary | ICD-10-CM | POA: Diagnosis present

## 2021-06-14 DIAGNOSIS — I5021 Acute systolic (congestive) heart failure: Secondary | ICD-10-CM | POA: Diagnosis present

## 2021-06-14 DIAGNOSIS — R092 Respiratory arrest: Secondary | ICD-10-CM

## 2021-06-14 DIAGNOSIS — Z79899 Other long term (current) drug therapy: Secondary | ICD-10-CM

## 2021-06-14 DIAGNOSIS — D72829 Elevated white blood cell count, unspecified: Secondary | ICD-10-CM | POA: Diagnosis present

## 2021-06-14 DIAGNOSIS — I468 Cardiac arrest due to other underlying condition: Secondary | ICD-10-CM | POA: Diagnosis present

## 2021-06-14 DIAGNOSIS — R9401 Abnormal electroencephalogram [EEG]: Secondary | ICD-10-CM | POA: Diagnosis present

## 2021-06-14 DIAGNOSIS — G253 Myoclonus: Secondary | ICD-10-CM | POA: Diagnosis present

## 2021-06-14 DIAGNOSIS — I248 Other forms of acute ischemic heart disease: Secondary | ICD-10-CM | POA: Diagnosis present

## 2021-06-14 DIAGNOSIS — G928 Other toxic encephalopathy: Secondary | ICD-10-CM | POA: Diagnosis present

## 2021-06-14 DIAGNOSIS — Z978 Presence of other specified devices: Secondary | ICD-10-CM

## 2021-06-14 DIAGNOSIS — E878 Other disorders of electrolyte and fluid balance, not elsewhere classified: Secondary | ICD-10-CM | POA: Diagnosis present

## 2021-06-14 DIAGNOSIS — I469 Cardiac arrest, cause unspecified: Secondary | ICD-10-CM | POA: Diagnosis present

## 2021-06-14 DIAGNOSIS — I214 Non-ST elevation (NSTEMI) myocardial infarction: Secondary | ICD-10-CM | POA: Diagnosis present

## 2021-06-14 DIAGNOSIS — Z515 Encounter for palliative care: Secondary | ICD-10-CM

## 2021-06-14 DIAGNOSIS — J9622 Acute and chronic respiratory failure with hypercapnia: Secondary | ICD-10-CM | POA: Diagnosis present

## 2021-06-14 DIAGNOSIS — J9621 Acute and chronic respiratory failure with hypoxia: Secondary | ICD-10-CM | POA: Diagnosis present

## 2021-06-14 DIAGNOSIS — R1110 Vomiting, unspecified: Secondary | ICD-10-CM

## 2021-06-14 DIAGNOSIS — N179 Acute kidney failure, unspecified: Secondary | ICD-10-CM | POA: Diagnosis present

## 2021-06-14 DIAGNOSIS — J9601 Acute respiratory failure with hypoxia: Secondary | ICD-10-CM

## 2021-06-14 LAB — BLOOD GAS, ARTERIAL
Acid-base deficit: 4.7 mmol/L — ABNORMAL HIGH (ref 0.0–2.0)
Bicarbonate: 23.6 mmol/L (ref 20.0–28.0)
FIO2: 100
MECHVT: 500 mL
Mechanical Rate: 26
O2 Saturation: 99.3 %
PEEP: 10 cmH2O
Patient temperature: 37
pCO2 arterial: 55 mmHg — ABNORMAL HIGH (ref 32.0–48.0)
pH, Arterial: 7.24 — ABNORMAL LOW (ref 7.350–7.450)
pO2, Arterial: 168 mmHg — ABNORMAL HIGH (ref 83.0–108.0)

## 2021-06-14 LAB — CBC WITH DIFFERENTIAL/PLATELET
Abs Immature Granulocytes: 2.17 10*3/uL — ABNORMAL HIGH (ref 0.00–0.07)
Basophils Absolute: 0.4 10*3/uL — ABNORMAL HIGH (ref 0.0–0.1)
Basophils Relative: 1 %
Eosinophils Absolute: 0.7 10*3/uL — ABNORMAL HIGH (ref 0.0–0.5)
Eosinophils Relative: 2 %
HCT: 48.2 % (ref 39.0–52.0)
Hemoglobin: 15.5 g/dL (ref 13.0–17.0)
Immature Granulocytes: 6 %
Lymphocytes Relative: 34 %
Lymphs Abs: 11.9 10*3/uL — ABNORMAL HIGH (ref 0.7–4.0)
MCH: 30.2 pg (ref 26.0–34.0)
MCHC: 32.2 g/dL (ref 30.0–36.0)
MCV: 93.8 fL (ref 80.0–100.0)
Monocytes Absolute: 2.4 10*3/uL — ABNORMAL HIGH (ref 0.1–1.0)
Monocytes Relative: 7 %
Neutro Abs: 17.6 10*3/uL — ABNORMAL HIGH (ref 1.7–7.7)
Neutrophils Relative %: 50 %
Platelets: 403 10*3/uL — ABNORMAL HIGH (ref 150–400)
RBC: 5.14 MIL/uL (ref 4.22–5.81)
RDW: 13.4 % (ref 11.5–15.5)
Smear Review: NORMAL
WBC: 35.1 10*3/uL — ABNORMAL HIGH (ref 4.0–10.5)
nRBC: 0.2 % (ref 0.0–0.2)

## 2021-06-14 LAB — COMPREHENSIVE METABOLIC PANEL
ALT: 82 U/L — ABNORMAL HIGH (ref 0–44)
AST: 122 U/L — ABNORMAL HIGH (ref 15–41)
Albumin: 4.1 g/dL (ref 3.5–5.0)
Alkaline Phosphatase: 114 U/L (ref 38–126)
Anion gap: 12 (ref 5–15)
BUN: 11 mg/dL (ref 6–20)
CO2: 24 mmol/L (ref 22–32)
Calcium: 9.5 mg/dL (ref 8.9–10.3)
Chloride: 99 mmol/L (ref 98–111)
Creatinine, Ser: 1.56 mg/dL — ABNORMAL HIGH (ref 0.61–1.24)
GFR, Estimated: >60 mL/min (ref >60)
Glucose, Bld: 273 mg/dL — ABNORMAL HIGH (ref 70–99)
Potassium: 6.8 mmol/L (ref 3.5–5.1)
Sodium: 135 mmol/L (ref 135–145)
Total Bilirubin: 0.5 mg/dL (ref 0.3–1.2)
Total Protein: 8.2 g/dL — ABNORMAL HIGH (ref 6.5–8.1)

## 2021-06-14 LAB — TROPONIN I (HIGH SENSITIVITY)
Troponin I (High Sensitivity): 118 ng/L (ref <18)
Troponin I (High Sensitivity): 476 ng/L (ref <18)

## 2021-06-14 LAB — GLUCOSE, CAPILLARY: Glucose-Capillary: 183 mg/dL — ABNORMAL HIGH (ref 70–99)

## 2021-06-14 LAB — MAGNESIUM: Magnesium: 3.2 mg/dL — ABNORMAL HIGH (ref 1.7–2.4)

## 2021-06-14 LAB — RESP PANEL BY RT-PCR (FLU A&B, COVID) ARPGX2
Influenza A by PCR: NEGATIVE
Influenza B by PCR: NEGATIVE
SARS Coronavirus 2 by RT PCR: NEGATIVE

## 2021-06-14 LAB — LACTIC ACID, PLASMA: Lactic Acid, Venous: 7.5 mmol/L (ref 0.5–1.9)

## 2021-06-14 LAB — PHOSPHORUS: Phosphorus: 5.8 mg/dL — ABNORMAL HIGH (ref 2.5–4.6)

## 2021-06-14 LAB — BRAIN NATRIURETIC PEPTIDE: B Natriuretic Peptide: 43.3 pg/mL (ref 0.0–100.0)

## 2021-06-14 MED ORDER — MAGNESIUM SULFATE 2 GM/50ML IV SOLN
INTRAVENOUS | Status: AC
  Administered 2021-06-14: 2 g via INTRAVENOUS
  Filled 2021-06-14: qty 50

## 2021-06-14 MED ORDER — FENTANYL 2500MCG IN NS 250ML (10MCG/ML) PREMIX INFUSION
25.0000 ug/h | INTRAVENOUS | Status: DC
  Administered 2021-06-14: 50 ug/h via INTRAVENOUS
  Administered 2021-06-15: 125 ug/h via INTRAVENOUS
  Administered 2021-06-16: 200 ug/h via INTRAVENOUS
  Administered 2021-06-16: 75 ug/h via INTRAVENOUS
  Filled 2021-06-14 (×4): qty 250

## 2021-06-14 MED ORDER — ONDANSETRON HCL 4 MG/2ML IJ SOLN
4.0000 mg | Freq: Four times a day (QID) | INTRAMUSCULAR | Status: DC | PRN
  Administered 2021-06-14: 4 mg via INTRAVENOUS
  Filled 2021-06-14: qty 2

## 2021-06-14 MED ORDER — DOCUSATE SODIUM 50 MG/5ML PO LIQD
100.0000 mg | Freq: Two times a day (BID) | ORAL | Status: DC
  Administered 2021-06-15 – 2021-06-17 (×4): 100 mg
  Filled 2021-06-14 (×7): qty 10

## 2021-06-14 MED ORDER — SODIUM BICARBONATE 8.4 % IV SOLN
50.0000 meq | Freq: Once | INTRAVENOUS | Status: AC
  Administered 2021-06-14: 50 meq via INTRAVENOUS

## 2021-06-14 MED ORDER — PANTOPRAZOLE SODIUM 40 MG IV SOLR
40.0000 mg | Freq: Every day | INTRAVENOUS | Status: DC
  Administered 2021-06-14 – 2021-06-15 (×2): 40 mg via INTRAVENOUS
  Filled 2021-06-14 (×2): qty 40

## 2021-06-14 MED ORDER — SODIUM BICARBONATE 8.4 % IV SOLN
50.0000 meq | Freq: Once | INTRAVENOUS | Status: AC
  Administered 2021-06-14: 50 meq via INTRAVENOUS
  Filled 2021-06-14: qty 50

## 2021-06-14 MED ORDER — IPRATROPIUM-ALBUTEROL 0.5-2.5 (3) MG/3ML IN SOLN
3.0000 mL | Freq: Once | RESPIRATORY_TRACT | Status: AC
  Administered 2021-06-14: 3 mL via RESPIRATORY_TRACT
  Filled 2021-06-14: qty 3

## 2021-06-14 MED ORDER — MIDAZOLAM HCL 2 MG/2ML IJ SOLN
2.0000 mg | Freq: Once | INTRAMUSCULAR | Status: AC
  Administered 2021-06-14: 2 mg via INTRAVENOUS
  Filled 2021-06-14: qty 2

## 2021-06-14 MED ORDER — SODIUM CHLORIDE 0.9 % IV SOLN: 25.0000 mg | Freq: Four times a day (QID) | INTRAVENOUS | Status: DC | PRN

## 2021-06-14 MED ORDER — IPRATROPIUM-ALBUTEROL 0.5-2.5 (3) MG/3ML IN SOLN: 9.0000 mL | Freq: Once | RESPIRATORY_TRACT | Status: AC

## 2021-06-14 MED ORDER — LORAZEPAM 2 MG/ML IJ SOLN
2.0000 mg | Freq: Once | INTRAMUSCULAR | Status: DC
Start: 2021-06-14 — End: 2021-06-14
  Filled 2021-06-14: qty 1

## 2021-06-14 MED ORDER — SODIUM CHLORIDE 0.9 % IV SOLN
2.0000 g | INTRAVENOUS | Status: DC
  Administered 2021-06-15 (×2): 2 g via INTRAVENOUS
  Filled 2021-06-14: qty 2
  Filled 2021-06-14: qty 20
  Filled 2021-06-14: qty 2

## 2021-06-14 MED ORDER — LORAZEPAM 2 MG/ML IJ SOLN
1.0000 mg | Freq: Four times a day (QID) | INTRAMUSCULAR | Status: DC
  Administered 2021-06-15 – 2021-06-16 (×6): 1 mg via INTRAVENOUS
  Filled 2021-06-14 (×7): qty 1

## 2021-06-14 MED ORDER — HEPARIN SODIUM (PORCINE) 5000 UNIT/ML IJ SOLN
5000.0000 [IU] | Freq: Three times a day (TID) | INTRAMUSCULAR | Status: DC
  Filled 2021-06-14: qty 1

## 2021-06-14 MED ORDER — SODIUM CHLORIDE 0.9 % IV BOLUS
1000.0000 mL | Freq: Once | INTRAVENOUS | Status: AC
  Administered 2021-06-14: 1000 mL via INTRAVENOUS

## 2021-06-14 MED ORDER — SODIUM CHLORIDE 0.9 % IV SOLN
250.0000 mL | INTRAVENOUS | Status: DC
  Administered 2021-06-15: 250 mL via INTRAVENOUS

## 2021-06-14 MED ORDER — SODIUM CHLORIDE 0.9 % IV SOLN: 500.0000 mg | INTRAVENOUS | Status: DC

## 2021-06-14 MED ORDER — DEXTROSE 50 % IV SOLN
1.0000 | Freq: Once | INTRAVENOUS | Status: AC
  Administered 2021-06-14: 50 mL via INTRAVENOUS

## 2021-06-14 MED ORDER — IOHEXOL 350 MG/ML SOLN
100.0000 mL | Freq: Once | INTRAVENOUS | Status: AC | PRN
  Administered 2021-06-14: 100 mL via INTRAVENOUS

## 2021-06-14 MED ORDER — INSULIN ASPART 100 UNIT/ML IV SOLN
10.0000 [IU] | Freq: Once | INTRAVENOUS | Status: AC
  Administered 2021-06-14: 10 [IU] via INTRAVENOUS
  Filled 2021-06-14 (×2): qty 0.1

## 2021-06-14 MED ORDER — IPRATROPIUM-ALBUTEROL 0.5-2.5 (3) MG/3ML IN SOLN
RESPIRATORY_TRACT | Status: AC
  Administered 2021-06-14: 9 mL via RESPIRATORY_TRACT
  Filled 2021-06-14: qty 9

## 2021-06-14 MED ORDER — MIDAZOLAM HCL 2 MG/2ML IJ SOLN
2.0000 mg | INTRAMUSCULAR | Status: DC | PRN
  Administered 2021-06-15 – 2021-06-17 (×3): 2 mg via INTRAVENOUS
  Filled 2021-06-14 (×3): qty 2

## 2021-06-14 MED ORDER — CALCIUM GLUCONATE-NACL 2-0.675 GM/100ML-% IV SOLN
2.0000 g | Freq: Once | INTRAVENOUS | Status: AC
  Administered 2021-06-14: 2000 mg via INTRAVENOUS
  Filled 2021-06-14: qty 100

## 2021-06-14 MED ORDER — NOREPINEPHRINE 4 MG/250ML-% IV SOLN
0.0000 ug/min | INTRAVENOUS | Status: DC
  Administered 2021-06-14: 40 ug/min via INTRAVENOUS

## 2021-06-14 MED ORDER — POLYETHYLENE GLYCOL 3350 17 G PO PACK: 17.0000 g | PACK | Freq: Every day | ORAL | Status: DC | PRN

## 2021-06-14 MED ORDER — MAGNESIUM SULFATE 2 GM/50ML IV SOLN: 2.0000 g | Freq: Once | INTRAVENOUS | Status: AC

## 2021-06-14 MED ORDER — FENTANYL BOLUS VIA INFUSION
50.0000 ug | INTRAVENOUS | Status: DC | PRN
  Filled 2021-06-14: qty 50

## 2021-06-14 MED ORDER — METHYLPREDNISOLONE SODIUM SUCC 125 MG IJ SOLR
60.0000 mg | Freq: Three times a day (TID) | INTRAMUSCULAR | Status: AC
  Administered 2021-06-14 – 2021-06-15 (×4): 60 mg via INTRAVENOUS
  Filled 2021-06-14 (×4): qty 2

## 2021-06-14 MED ORDER — EPINEPHRINE 1 MG/10ML IJ SOSY
0.1000 mg | PREFILLED_SYRINGE | Freq: Once | INTRAMUSCULAR | Status: AC
  Administered 2021-06-14: 0.1 mg via INTRAVENOUS

## 2021-06-14 MED ORDER — POLYETHYLENE GLYCOL 3350 17 G PO PACK
17.0000 g | PACK | Freq: Every day | ORAL | Status: DC
  Administered 2021-06-16 – 2021-06-17 (×2): 17 g
  Filled 2021-06-14 (×3): qty 1

## 2021-06-14 MED ORDER — PROPOFOL 1000 MG/100ML IV EMUL
0.0000 ug/kg/min | INTRAVENOUS | Status: DC
  Administered 2021-06-15 (×3): 40 ug/kg/min via INTRAVENOUS
  Administered 2021-06-15 (×2): 20 ug/kg/min via INTRAVENOUS
  Administered 2021-06-15: 40 ug/kg/min via INTRAVENOUS
  Administered 2021-06-16 (×2): 20 ug/kg/min via INTRAVENOUS
  Filled 2021-06-14 (×10): qty 100

## 2021-06-14 MED ORDER — FENTANYL CITRATE (PF) 100 MCG/2ML IJ SOLN
50.0000 ug | Freq: Once | INTRAMUSCULAR | Status: AC
  Administered 2021-06-14: 50 ug via INTRAVENOUS
  Filled 2021-06-14: qty 2

## 2021-06-14 MED ORDER — VANCOMYCIN HCL IN DEXTROSE 1-5 GM/200ML-% IV SOLN
1000.0000 mg | Freq: Once | INTRAVENOUS | Status: DC
  Filled 2021-06-14: qty 200

## 2021-06-14 MED ORDER — PROPOFOL 1000 MG/100ML IV EMUL
INTRAVENOUS | Status: AC
  Administered 2021-06-14: 5.29 ug/kg/min via INTRAVENOUS
  Filled 2021-06-14: qty 100

## 2021-06-14 MED ORDER — DOCUSATE SODIUM 50 MG/5ML PO LIQD
100.0000 mg | Freq: Every day | ORAL | Status: DC | PRN
  Filled 2021-06-14: qty 10

## 2021-06-14 MED ORDER — DOCUSATE SODIUM 100 MG PO CAPS: 100.0000 mg | ORAL_CAPSULE | Freq: Two times a day (BID) | ORAL | Status: DC | PRN

## 2021-06-14 MED ORDER — SODIUM CHLORIDE 0.9 % IV SOLN: 0.0000 ug/min | INTRAVENOUS | Status: DC

## 2021-06-14 MED ORDER — VANCOMYCIN HCL 1500 MG/300ML IV SOLN
1500.0000 mg | Freq: Once | INTRAVENOUS | Status: AC
  Administered 2021-06-15: 1500 mg via INTRAVENOUS
  Filled 2021-06-14 (×3): qty 300

## 2021-06-14 MED ORDER — NOREPINEPHRINE 4 MG/250ML-% IV SOLN: 2.0000 ug/min | INTRAVENOUS | Status: DC

## 2021-06-14 MED ORDER — IPRATROPIUM-ALBUTEROL 0.5-2.5 (3) MG/3ML IN SOLN
3.0000 mL | RESPIRATORY_TRACT | Status: DC
  Administered 2021-06-14 – 2021-06-17 (×16): 3 mL via RESPIRATORY_TRACT
  Filled 2021-06-14 (×16): qty 3

## 2021-06-14 MED ORDER — METHYLPREDNISOLONE SODIUM SUCC 40 MG IJ SOLR
40.0000 mg | Freq: Two times a day (BID) | INTRAMUSCULAR | Status: DC
  Administered 2021-06-16: 40 mg via INTRAVENOUS
  Filled 2021-06-14: qty 1

## 2021-06-14 MED ORDER — SODIUM CHLORIDE 0.9 % IV SOLN
250.0000 mL | INTRAVENOUS | Status: DC
  Administered 2021-06-15: 25 mL via INTRAVENOUS
  Administered 2021-06-15 – 2021-06-17 (×2): 250 mL via INTRAVENOUS

## 2021-06-14 MED ORDER — PHENYLEPHRINE HCL (PRESSORS) 10 MG/ML IV SOLN
25.0000 ug/min | INTRAVENOUS | Status: DC
  Filled 2021-06-14: qty 2

## 2021-06-14 MED ORDER — SODIUM CHLORIDE 0.9 % IV SOLN
100.0000 mg | Freq: Two times a day (BID) | INTRAVENOUS | Status: DC
  Administered 2021-06-15 (×3): 100 mg via INTRAVENOUS
  Filled 2021-06-14 (×5): qty 100

## 2021-06-14 MED ORDER — SODIUM CHLORIDE 0.9 % IV SOLN
20.0000 mg/kg | Freq: Once | INTRAVENOUS | Status: AC
  Administered 2021-06-15: 3150 mg via INTRAVENOUS
  Filled 2021-06-14: qty 31.5

## 2021-06-14 NOTE — ED Notes (Signed)
Pulse check, PEA on monitor, no pulse. CPR continued and EPI given.

## 2021-06-14 NOTE — ED Provider Notes (Signed)
[facility] Emergency Department Provider Note   ____________________________________________   Event Date/Time   First MD Initiated Contact with Patient 06-15-21 2019     (approximate)  I have reviewed the triage vital signs and the nursing notes.   HISTORY  Chief Complaint No chief complaint on file.    HPI Edward Jennings is a 25 y.o. male with the below listed past medical history who presents via EMS for respiratory distress.  Upon patient's arrival he was thrashing around on the bed until he became unresponsive, stopped breathing, and lost pulses before CPR was started.  Further history and review of systems are unable to be obtained at this time given patient acuity and mental status          Past Medical History:  Diagnosis Date   Asthma    Obesity     Patient Active Problem List   Diagnosis Date Noted   Cardiac arrest (HCC) 2021/06/15   Asthma exacerbation 08/03/2017    No past surgical history on file.  Prior to Admission medications   Medication Sig Start Date End Date Taking? Authorizing Provider  albuterol (PROVENTIL) (2.5 MG/3ML) 0.083% nebulizer solution Take 3 mLs (2.5 mg total) by nebulization every 6 (six) hours as needed for wheezing or shortness of breath. 12/02/19   Dionne Bucy, MD  fluticasone-salmeterol (ADVAIR DISKUS) 250-50 MCG/ACT AEPB Inhale 1 puff into the lungs in the morning and at bedtime. 02/25/21   Delton Prairie, MD  Fluticasone-Salmeterol (ADVAIR DISKUS) 250-50 MCG/DOSE AEPB Inhale 1 puff into the lungs 2 (two) times daily. 03/16/18 03/16/19  Emily Filbert, MD  montelukast (SINGULAIR) 10 MG tablet Take 1 tablet (10 mg total) by mouth at bedtime. 03/16/18   Emily Filbert, MD  predniSONE (DELTASONE) 50 MG tablet Take 1 tablet (50 mg total) by mouth daily. 02/25/21   Delton Prairie, MD  Spacer/Aero Chamber Mouthpiece MISC 1 Units by Does not apply route every 4 (four) hours as needed (wheezing). 07/21/17    Merrily Brittle, MD    Allergies Patient has no known allergies.  No family history on file.  Social History Social History   Tobacco Use   Smoking status: Never   Smokeless tobacco: Never  Vaping Use   Vaping Use: Never used  Substance Use Topics   Alcohol use: Yes    Comment: social   Drug use: No    Review of Systems Unable to assess ___________________________________________   PHYSICAL EXAM:  VITAL SIGNS: ED Triage Vitals [June 15, 2021 2002]  Enc Vitals Group     BP      Pulse      Resp      Temp      Temp src      SpO2      Weight (!) 346 lb 12.5 oz (157.3 kg)     Height      Head Circumference      Peak Flow      Pain Score      Pain Loc      Pain Edu?      Excl. in GC?    Constitutional: Unresponsive morbidly obese African-American male who appears stated age Eyes: Conjunctivae are injected. PERRL. Head: Atraumatic. Nose: No congestion/rhinnorhea. Mouth/Throat: Mucous membranes are moist. Neck: No stridor Cardiovascular: Pulseless.  No heart sounds appreciated Respiratory: No spontaneous respirations.  Inspiratory and expiratory wheezes bilaterally Genitourinary: Normal external circumcised male genitalia without lesions or rash Gastrointestinal: Soft. No distention. Musculoskeletal: No obvious deformities  Neurologic: GCS 3 Skin:  Skin is warm and dry. No rash noted. ____________________________________________   LABS (all labs ordered are listed, but only abnormal results are displayed)  Labs Reviewed  BLOOD GAS, ARTERIAL - Abnormal; Notable for the following components:      Result Value   pH, Arterial <6.900 (*)    pCO2 arterial >120.0 (*)    pO2, Arterial 115 (*)    Allens test (pass/fail) YES (*)    All other components within normal limits  COMPREHENSIVE METABOLIC PANEL - Abnormal; Notable for the following components:   Potassium 6.8 (*)    Glucose, Bld 273 (*)    Creatinine, Ser 1.56 (*)    Total Protein 8.2 (*)    AST 122 (*)     ALT 82 (*)    All other components within normal limits  LACTIC ACID, PLASMA - Abnormal; Notable for the following components:   Lactic Acid, Venous 7.5 (*)    All other components within normal limits  CBC WITH DIFFERENTIAL/PLATELET - Abnormal; Notable for the following components:   WBC 35.1 (*)    Platelets 403 (*)    Neutro Abs 17.6 (*)    Lymphs Abs 11.9 (*)    Monocytes Absolute 2.4 (*)    Eosinophils Absolute 0.7 (*)    Basophils Absolute 0.4 (*)    Abs Immature Granulocytes 2.17 (*)    All other components within normal limits  BLOOD GAS, ARTERIAL - Abnormal; Notable for the following components:   pH, Arterial 7.24 (*)    pCO2 arterial 55 (*)    pO2, Arterial 168 (*)    Acid-base deficit 4.7 (*)    All other components within normal limits  GLUCOSE, CAPILLARY - Abnormal; Notable for the following components:   Glucose-Capillary 183 (*)    All other components within normal limits  TROPONIN I (HIGH SENSITIVITY) - Abnormal; Notable for the following components:   Troponin I (High Sensitivity) 118 (*)    All other components within normal limits  RESP PANEL BY RT-PCR (FLU A&B, COVID) ARPGX2  CULTURE, RESPIRATORY W GRAM STAIN  RESPIRATORY PANEL BY PCR  BRAIN NATRIURETIC PEPTIDE  URINALYSIS, COMPLETE (UACMP) WITH MICROSCOPIC  URINE DRUG SCREEN, QUALITATIVE ([facility] ONLY)  HIV ANTIBODY (ROUTINE TESTING W REFLEX)  CBC  BLOOD GAS, ARTERIAL  MAGNESIUM  PHOSPHORUS  TRIGLYCERIDES  COMPREHENSIVE METABOLIC PANEL  MAGNESIUM  PHOSPHORUS  PROCALCITONIN  PROCALCITONIN  TROPONIN I (HIGH SENSITIVITY)   ____________________________________________  EKG  ED ECG REPORT I, Merwyn Katos, the attending physician, personally viewed and interpreted this ECG.  Date: 06/17/2021 EKG Time: 1943 Rate: 155 Rhythm: Tachycardic sinus rhythm QRS Axis: normal Intervals: normal ST/T Wave abnormalities: normal Narrative Interpretation: Tachycardic sinus rhythm.  No evidence of acute  ischemia  ____________________________________________  RADIOLOGY  ED MD interpretation: Single view portable x-ray of the chest shows ET tube in adequate positioning and nasogastric tube at the level of the stomach.  There is also evidence of perihilar infiltrate consistent with edema or infectious process  Official radiology report(s): CT HEAD WO CONTRAST ( )  Result Date: 06/11/2021 CLINICAL DATA:  Mental status change after cardiac arrest. EXAM: CT HEAD WITHOUT CONTRAST TECHNIQUE: Contiguous axial images were obtained from the base of the skull through the vertex without intravenous contrast. COMPARISON:  None. FINDINGS: Brain: No evidence of acute infarction, hemorrhage, hydrocephalus, extra-axial collection or mass lesion/mass effect. Vascular: No hyperdense vessel or unexpected calcification. Skull: Normal. Negative for fracture or focal lesion. Sinuses/Orbits: Air-fluid levels throughout  the paranasal sinuses, likely related to intubation. Mastoid air cells are clear. Other: Oral enteric and tracheal tubes are seen. IMPRESSION: No acute intracranial abnormalities are identified. Electronically Signed   By: Burman Nieves M.D.   On: July 10, 2021 22:32   CT Angio Chest Pulmonary Embolism (PE) W or WO Contrast  Result Date: 2021-07-10 CLINICAL DATA:  Pulmonary embolus suspected with high probability. Mental status change of unknown cause. EXAM: CT ANGIOGRAPHY CHEST WITH CONTRAST TECHNIQUE: Multidetector CT imaging of the chest was performed using the standard protocol during bolus administration of intravenous contrast. Multiplanar CT image reconstructions and MIPs were obtained to evaluate the vascular anatomy. CONTRAST:  OMNIPAQUE IOHEXOL 350 MG/ML SOLN COMPARISON:  Chest radiograph 07/10/2021 FINDINGS: Cardiovascular: Examination is technically limited due to motion and streak artifact and limited contrast bolus. Heterogeneous nonsegmental low-attenuation areas are demonstrated  consistent with artifact. No definite evidence of any large central pulmonary embolus. Small peripheral emboli could be obscured. Normal heart size. No pericardial effusions. Normal caliber thoracic aorta. Mediastinum/Nodes: Lymph nodes are demonstrated in the mediastinum without pathologic enlargement, likely reactive. Endotracheal and enteric tubes are present. Esophagus is decompressed. Lungs/Pleura: Motion artifact limits examination. There is focal consolidation with volume loss in the lower lungs bilaterally with some peripheral patchy infiltrates in the upper lungs. This could indicate areas of atelectasis or consolidation. No pleural effusions. No pneumothorax. Upper Abdomen: No acute abnormality. Musculoskeletal: No chest wall abnormality. No acute or significant osseous findings. Apparent deformity of the sternum is likely artifact from motion. Review of the MIP images confirms the above findings. IMPRESSION: 1. Examination is technically limited as described. No definite evidence of any large central pulmonary embolus. 2. Patchy areas of infiltration or atelectasis demonstrated bilaterally. Focal consolidation and volume loss in the lower lungs. Changes could represent atelectasis or multifocal pneumonia. Electronically Signed   By: Burman Nieves M.D.   On: 07-10-2021 22:45   DG Chest Portable 1 View  Result Date: 2021-07-10 CLINICAL DATA:  Status post intubation. EXAM: PORTABLE CHEST 1 VIEW COMPARISON:  02/25/2021 FINDINGS: Endotracheal tube is in place, tip approximately 3.2 centimeters above the carina. The nasogastric tube has been placed, tip difficult to visualized but at least to the level of the stomach. Heart is mildly enlarged. There is mild perihilar infiltrate consistent with edema or infectious process. IMPRESSION: Interval intubation. Electronically Signed   By: Norva Pavlov M.D.   On: Jul 10, 2021 20:10     ____________________________________________   PROCEDURES  Procedure(s) performed (including Critical Care):  .Critical Care  Date/Time: 07-10-2021 9:01 PM Performed by: Merwyn Katos, MD Authorized by: Merwyn Katos, MD   Critical care provider statement:    Critical care time (minutes):  63   Critical care time was exclusive of:  Separately billable procedures and treating other patients   Critical care was necessary to treat or prevent imminent or life-threatening deterioration of the following conditions:  Cardiac failure and respiratory failure   Critical care was time spent personally by me on the following activities:  Discussions with consultants, evaluation of patient's response to treatment, examination of patient, ordering and performing treatments and interventions, ordering and review of laboratory studies, ordering and review of radiographic studies, pulse oximetry, re-evaluation of patient's condition, obtaining history from patient or surrogate and review of old charts   I assumed direction of critical care for this patient from another provider in my specialty: no     Care discussed with: admitting provider   Procedure Name: Intubation Date/Time: 07/10/2021  9:02 PM Performed by: Merwyn KatosBradler, Kailly Richoux K, MD Pre-anesthesia Checklist: Patient identified, Patient being monitored, Emergency Drugs available and Suction available Oxygen Delivery Method: Ambu bag Preoxygenation: Pre-oxygenation with 100% oxygen Ventilation: Mask ventilation without difficulty Laryngoscope Size: Glidescope Grade View: Grade I Tube size: 8.0 mm Number of attempts: 2 Airway Equipment and Method: Video-laryngoscopy Placement Confirmation: ETT inserted through vocal cords under direct vision, CO2 detector and Breath sounds checked- equal and bilateral Secured at: 26 cm Tube secured with: ETT holder Dental Injury: Teeth and Oropharynx as per pre-operative assessment     .1-3 Lead EKG  Interpretation  Date/Time: 05/27/2021 9:03 PM Performed by: Merwyn KatosBradler, Hendryx Ricke K, MD Authorized by: Merwyn KatosBradler, Lemmie Vanlanen K, MD     Interpretation: abnormal     ECG rate:  138   ECG rate assessment: tachycardic     Rhythm: sinus tachycardia     Ectopy: none     Conduction: normal     ____________________________________________   INITIAL IMPRESSION / ASSESSMENT AND PLAN / ED COURSE  As part of my medical decision making, I reviewed the following data within the electronic medical record, if available:  Nursing notes reviewed and incorporated, Labs reviewed, EKG interpreted, Old chart reviewed, Radiograph reviewed and Notes from prior ED visits reviewed and incorporated      Patient is a 25 year old male who presents in acute respiratory distress and suffered a cardiac arrest arrest upon arrival.  CPR was begun and received approximately 30 minutes of ACLS protocol including multiple rounds of epinephrine before being started on a norepinephrine drip.  After ROSC was achieved, patient's lung exam was still concerning for inspiratory and expiratory wheezing bilaterally and need for higher PEEP.  We will continue patient on multiple duo nebs as well as treat with Solu-Medrol.  I spoke to NP Nedra HaiLee in intensive care who agrees accept this patient onto her service.   Dispo: Admit to ICU     ____________________________________________   FINAL CLINICAL IMPRESSION(S) / ED DIAGNOSES  Final diagnoses:  Acute respiratory failure with hypoxia and hypercapnia (HCC)  Respiratory arrest (HCC)  Cardiac arrest (HCC)  Severe persistent asthma with acute exacerbation     ED Discharge Orders     None        Note:  This document was prepared using Dragon voice recognition software and may include unintentional dictation errors.    Merwyn KatosBradler, Stein Windhorst K, MD 06/09/2021 [phone]

## 2021-06-14 NOTE — ED Notes (Signed)
Pt with pulse on monitor with loss of pulse within 10 beats. EPI delivered. CPR continued.

## 2021-06-14 NOTE — ED Notes (Signed)
Loss of pulse, CPR initiated, EPI delivered.

## 2021-06-14 NOTE — H&P (Addendum)
NAME:  Edward Jennings, MRN:  [id number], DOB:  20-Oct-1996, LOS: 0 ADMISSION DATE:  07-14-2021, CONSULTATION DATE:  July 14, 2021 REFERRING MD:  Dr. Vicente Males, CHIEF COMPLAINT:  cardiac/respiratory arrest   History of Present Illness:  25 yo M presenting to [facility] ED via EMS from home in respiratory distress. Per EDP handoff the patient received Duo-neb and 125 mg of Solu-medrol with EMS, who placed the patient on CPAP for respiratory support. EMS reported on arrival to ED that the patient became severely agitated en route, throwing EMS personnel across the truck" ED course: Upon arrival to [facility] ED patient was unresponsive & in respiratory distress, then shortly after arrival lost pulses. Initial rhythm was asystole then PEA, he received CPR for 30 minutes including multiple rounds of epinephrine and initiation of a levophed drip. Patient was emergently intubated requiring mechanical ventilatory support. Initial vitals: 37.5, tachypneic 23, tachycardic 138, BP 90/48 (61) on levophed drip & SPO2 97% on 100% FiO2 Significant Labs: hyperkalemia- 6.8, AKI- BUN/Cr: 11/ 1.56, mild Transaminitis: AST/ALT- 122/82, elevated troponin: 118, BNP- 43.3, lactic acidosis- 7.5, leukocytosis- 35.1, severe respiratory acidosis - ABG: < 6.9/ >120/ 115/ unreadable. Labs/ Imaging personally reviewed CT head wo contrast 07/14/2021 > no acute intracranial abnormality. CT angio chest Jul 14, 2021 > negative for PE, patchy areas of infiltration/ atelectasis bilaterally with focal consolidation & volume loss in the lower lungs. CXR 2021-07-14> mild perihilar infiltrate  I, Cheryll Cockayne Rust-Chester, AGACNP-BC, personally viewed and interpreted this ECG. EKG Interpretation Date: 2021-07-14 EKG Time: 19:43 Rate: 155 Rhythm: Sinus Tachycardia QRS Axis: borderline LAD Intervals: prolonged Qtc- 535 ST/T Wave abnormalities: diffuse ST depression Narrative Interpretation: ST with signs of ischemia and prolonged Qtc  Patient lives with his older  brother, Edward Jennings, who I reached by phone with his other brother- Edward Jennings's assistance.  Edward Jennings reported that he has heard the patient using his nebulizer machine in the middle of night frequently over the last 2 weeks. Edward Jennings denied anyone else in the household as being ill. He was not sure about signs/symptoms of illness, but reported that the patient was not acting differently and did not have any complaints recently.  Earlier this evening, 14-Jul-2021, Edward Jennings stated his brother called him on the phone from inside the house asking him to call the ambulance because he couldn't breathe. Edward Jennings reported that when he found the patient he was on his knees leaning on the bed for support and was so short of breath that he "stopped talking". Edward Jennings stated that the patient does NOT smoke cigarettes/use tobacco products, drinks socially only & denies any other recreational drug use.  PCCM consulted for admission to ICU. Pertinent  Medical History  Asthma Obesity  Significant Hospital Events: Including procedures, antibiotic start and stop dates in addition to other pertinent events   2021-07-14: Arrived in respiratory distress, in ED lost pulses- asystole then PEA, CPR for 30 minutes prior to ROSC- emergently intubated and admitted to ICU with normothermia protocol  Interim History / Subjective:  Patient unresponsive to painful stimuli, cough/gag/corneal/pupil reflexes intact. Patient with what appears to be having full body myoclonic jerking with eye opening- no tracking. Patient's OGT connected to suction but gastric secretions so thick that patient began having copious vomiting around OGT & ETT.  Objective   Weight (!) 157.3 kg.       No intake or output data in the 24 hours ending 07/14/21 2023 Filed Weights   07/14/2021 2002  Weight: (!) 157.3 kg    Examination: General: Adult male,  critically ill, lying in bed intubated requiring mechanical ventilation  HEENT: MM pink/moist, anicteric, sclera red,  atraumatic, neck supple Neuro: unresponsive with what appears to be intermittent full body myoclonic jerks with eye opening, unable to follow commands> no purposeful mvmt or mvmt to painful stimuli, PERRL +3, corneal/gag/cough intact CV: s1s2 RRR, ST on monitor, no r/m/g Pulm: Regular, non labored on assist control 100% & PEEP 10, breath sounds expiratory wheezing-BUL & diminished-BLL GI: soft, rounded, bs x 4 GU: foley in place with no UOP Skin: limited exam post arrest- no rashes/lesions noted Extremities: warm/dry, pulses + 2 R/P, no edema noted  Resolved Hospital Problem list     Assessment & Plan:  Cardiac arrest secondary to respiratory arrest in the setting of asthma exacerbation   Circulatory shock Elevated Troponin secondary to demand ischemia vs N-STEMI  QTc prolongation Initial rhythm asystole > PEA, 30 minutes downtime, suspected anoxic injury  UDS: pending, CTH: negative  - Start Normothermia protocol, if patient temp > 37.6, goal 37 degrees - Buspar/tylenol PRN per protocol to control shivering - Continue vasopressors: switch levophed to neo-synephrine d/t tachycardia, wean as tolerated to maintain MAP > 65 - Echocardiogram ordered - Trend troponin to peak: diffuse ST depression on EKG, consider heparin drip if troponin continues to climb significantly - Consider cardiology consult - continuous cardiac monitoring  Acute on chronic hypoxic/hypercapnic respiratory failure secondary to asthma exacerbation in the setting of cardiac arrest  PMHx: Asthma Patient sounded tight on arrival, ventilation improved post multiple duo-nebs & steroids - Ventilator settings: PRVC 7 mL/kg, 100% FiO2, 10 PEEP - Wean PEEP and FiO2 for sats greater than 90% - Plateau pressures less than 30 cm H20 - VAP bundle in place - Intermittent chest x-ray & ABG - Daily WUA/ SBT  - Ensure adequate pulmonary hygiene  - solumedrol 60 mg Q 8 x 1 day followed by 40 mg BID - Duo-nebs Q 4 h  Lactic  Acidosis in the setting of cardiac arrest Suspected Aspiration Pneumonia Leukocytosis Lactic: 7.5, PCT: pending, WBC: 35.1 - NS 2 L bolus ordered, will recheck lactic post bolus - F/u cultures, trend PCT, lactic, respiratory 20 pathogen panel pending - Continue Aspiration Pna coverage: Ceftriaxone & Doxycycline (*switched from azithromycin d/t prolonged QTc), one dose of vancomycin added for empiric coverage due to the amount of copious vomiting that occurred post intubation - daily CBC, monitor WBC/fever curve  At risk for anoxic encephalopathy Suspected myoclonic seizure activity 30 minutes downtime,  Initial head CT: negative - PAD protocol in place: propofol drip & fentanyl drip, versed IVP PRN - RASS goal: - 1 - neuro checks Q 4 - EEG ordered - consider repeat CT head/MRI as needed - Neuro consulted, appreciate input - loaded with Keppra due to full body myoclonic jerking, continue Keppra IV Q 12 h    Acute Kidney Injury in the setting of cardiac arrest Baseline Cr: 0.93, Cr on admission: 1.56 - Strict I/O's: alert provider if UOP < 0.5 mL/kg/hr - gentle IVF hydration  - Daily BMP, replace electrolytes PRN - Avoid nephrotoxic agents as able, ensure adequate renal perfusion - consider renal US  Copious refractory vomiting Mild Transaminitis in the setting of cardiac arrest - Trend hepatic function - Consider RUQ US/CT abdomen/pelvis if needed - avoid hepatotoxic agents - OGT replaced because of clogging, gastric secretions appear to be so thick they are clogging the OGT> 18 gauge is the largest size we have. Zofran IV given without effect. Patient has prolonged Qtc >  discussed with Rx who suggested ativan IV scheduled. Could also consider low dose prochlorperazine.  At risk for hyperglycemia during cooling - ICU hyperglycemia protocol in place - Initiate Q 4 CBG monitoring, target range 140 - 180 - SSI moderate scale ordered - Hemoglobin A1C pending  Best Practice (right  click and "Reselect all SmartList Selections" daily)  Diet/type: NPO w/ meds via tube DVT prophylaxis: prophylactic heparin  GI prophylaxis: PPI Lines: N/A- consent obtained for CVC placement Foley:  Yes, and it is still needed Code Status:  full code Last date of multidisciplinary goals of care discussion [07/04/2021]  Updated the patient's 2 brothers: Edward Jennings & Edward Jennings as well as the patient's mother Stanton Kidney. All questions and concerns answered at this time.  Labs   CBC: No results for input(s): WBC, NEUTROABS, HGB, HCT, MCV, PLT in the last 168 hours.  Basic Metabolic Panel: No results for input(s): NA, K, CL, CO2, GLUCOSE, BUN, CREATININE, CALCIUM, MG, PHOS in the last 168 hours. GFR: CrCl cannot be calculated (Patient's most recent lab result is older than the maximum 21 days allowed.). No results for input(s): PROCALCITON, WBC, LATICACIDVEN in the last 168 hours.  Liver Function Tests: No results for input(s): AST, ALT, ALKPHOS, BILITOT, PROT, ALBUMIN in the last 168 hours. No results for input(s): LIPASE, AMYLASE in the last 168 hours. No results for input(s): AMMONIA in the last 168 hours.  ABG    Component Value Date/Time   PHART <6.900 (LL) July 04, 2021 1958   PCO2ART >120.0 (HH) 07/04/21 1958   PO2ART 115 (H) 07-04-21 1958   HCO3 PENDING 07/04/21 1958   O2SAT PENDING Jul 04, 2021 1958     Coagulation Profile: No results for input(s): INR, PROTIME in the last 168 hours.  Cardiac Enzymes: No results for input(s): CKTOTAL, CKMB, CKMBINDEX, TROPONINI in the last 168 hours.  HbA1C: No results found for: HGBA1C  CBG: No results for input(s): GLUCAP in the last 168 hours.  Review of Systems:   UTA- patient unable to participate in interview  Past Medical History:  He,  has a past medical history of Asthma and Obesity.   Surgical History:  No past surgical history on file.   Social History:   reports that he has never smoked. He has never used smokeless tobacco.  He reports current alcohol use. He reports that he does not use drugs.   Family History:  His family history is not on file.   Allergies No Known Allergies   Home Medications  Prior to Admission medications   Medication Sig Start Date End Date Taking? Authorizing Provider  albuterol (PROVENTIL) (2.5 MG/3ML) 0.083% nebulizer solution Take 3 mLs (2.5 mg total) by nebulization every 6 (six) hours as needed for wheezing or shortness of breath. 12/02/19   Dionne Bucy, MD  fluticasone-salmeterol (ADVAIR DISKUS) 250-50 MCG/ACT AEPB Inhale 1 puff into the lungs in the morning and at bedtime. 02/25/21   Delton Prairie, MD  Fluticasone-Salmeterol (ADVAIR DISKUS) 250-50 MCG/DOSE AEPB Inhale 1 puff into the lungs 2 (two) times daily. 03/16/18 03/16/19  Emily Filbert, MD  montelukast (SINGULAIR) 10 MG tablet Take 1 tablet (10 mg total) by mouth at bedtime. 03/16/18   Emily Filbert, MD  predniSONE (DELTASONE) 50 MG tablet Take 1 tablet (50 mg total) by mouth daily. 02/25/21   Delton Prairie, MD  Spacer/Aero Chamber Mouthpiece MISC 1 Units by Does not apply route every 4 (four) hours as needed (wheezing). 07/21/17   Merrily Brittle, MD     Critical  care time: 65 minutes       [name], AGACNP-BC Acute Care Nurse Practitioner [facility]   [phone] / [phone] Please see Amion for pager details.

## 2021-06-14 NOTE — ED Notes (Signed)
Pulse check, asystole on monitor. EPI given and CPR continued.

## 2021-06-14 NOTE — ED Notes (Signed)
Pt with pulse on monitor and felt post check. [place] used by MD during pulse check.

## 2021-06-14 NOTE — ED Notes (Signed)
Pt cell phone given to pt brother at bedside.

## 2021-06-14 NOTE — Progress Notes (Addendum)
eLink Physician-Brief Progress Note Patient Name: Edward Jennings DOB: [birth year] MRN: [id number]   Date of Service  06/10/2021  HPI/Events of Note  25 yr old witnessed cardiac arrest, brought in by EMS pulseless, ACLS. Lost pulse in ED, PEA arrest ACLS-EPI once at around 7:25 and 7:38 Pm, on levophed.   Data: Reviewed K 6.9, severe resp and metabolic acidosis, improving on Vent and hyperkalemia Rx.elvated ast/alt Wbc: 35 K EKG ST, diffuse demand ST depression and qtc 535, prolonged. LA 7.5 Trop elevated 118. Hco3 at 24.  CxR film seen: peri hilar , rt > lt air space, could be aspiration. ET in place.  OG tip going below diaphragm and towards stomach.   Camera: On fenta/propofol sedation. On Levophed /neo Vent: in synchrony.  HR 117 MAP: 82 Sats:97%  A: S/p witnessed PEA arrest. AHHRF. Asthma exacerbation-Pneumonia- could be aspiration also. On Vent. S/p ACLS with ROSC. Demand elevation of troponin. Elevated LA. Hyperkalemia and prolonged qtc. Resp panel, CTH, UDS, is pending.    eICU Interventions  -s/p 2 lit EGDT, vanc, rocephin-azithromycin. Would avoid azithromycin. Instead give doxy  - on keppra, solumedrol/nebs.  - trend LA, troponin.  - follow UOP, keep MAP > 65. - lung protective ventilation, VAP bundle.  - on ssi, sq heparin as VTE . CBG goals < 180.prophylaxis. - follow LFT in am. -  Discussed with NP on secure chat. CTH neg. Normothermia to 37 degree as per policy CTA pending.      Intervention Category Major Interventions: Other:;Respiratory failure - evaluation and management;Acid-Base disturbance - evaluation and management;Electrolyte abnormality - evaluation and management Evaluation Type: New Patient Evaluation  Ranee Gosselin 05/28/2021, 10:07 PM  23:12 CTA: no PE, multifocal PNA CTH no acute changes . Covid/flu neg.

## 2021-06-14 NOTE — Progress Notes (Signed)
Chaplain responded for an emergency traffic.  Chaplain met brother in waiting area and facilitated communication with medical staff.  Chaplain provided emotional support, orientation and escorted brother to see pt in ED room, then to ICU waiting area.  Per NP brother will be permitted to see pt in ICU.    Brother is currently in ICU waiting area though he may step outside briefly.  Name: Edward Jennings (former [employer]), he is pt's older brother. Cell:  [phone].  Please contact as needed for ongoing support.   Minus Liberty, [place] Pager:  [phone]   05/31/2021 2000  Clinical Encounter Type  Visited With Family;Patient not available  Visit Type Initial  Referral From Nurse  Consult/Referral To Chaplain  Stress Factors  Patient Stress Factors Health changes

## 2021-06-14 NOTE — ED Notes (Signed)
Per critical care NP, hold ativan at this time.

## 2021-06-14 NOTE — ED Notes (Signed)
EPI given due to no pulse, CPR continued.

## 2021-06-14 NOTE — ED Notes (Signed)
Pt with pulse on monitor. Levophed started by verbal order of Bradler, MD.

## 2021-06-14 NOTE — ED Notes (Signed)
Pt in room 2 from EMS with no pulse. CPR initiated at this time.

## 2021-06-14 NOTE — ED Notes (Signed)
Pulse check with no pulse, CPR continued and EPI delivered.

## 2021-06-15 ENCOUNTER — Inpatient Hospital Stay: Payer: Self-pay

## 2021-06-15 DIAGNOSIS — Z9911 Dependence on respirator [ventilator] status: Secondary | ICD-10-CM

## 2021-06-15 DIAGNOSIS — J9601 Acute respiratory failure with hypoxia: Principal | ICD-10-CM

## 2021-06-15 DIAGNOSIS — R092 Respiratory arrest: Secondary | ICD-10-CM

## 2021-06-15 DIAGNOSIS — R569 Unspecified convulsions: Secondary | ICD-10-CM

## 2021-06-15 DIAGNOSIS — J9602 Acute respiratory failure with hypercapnia: Secondary | ICD-10-CM

## 2021-06-15 DIAGNOSIS — R7401 Elevation of levels of liver transaminase levels: Secondary | ICD-10-CM

## 2021-06-15 DIAGNOSIS — J4551 Severe persistent asthma with (acute) exacerbation: Principal | ICD-10-CM

## 2021-06-15 LAB — BLOOD GAS, ARTERIAL
Acid-base deficit: 7.3 mmol/L — ABNORMAL HIGH (ref 0.0–2.0)
Acid-base deficit: 7.8 mmol/L — ABNORMAL HIGH (ref 0.0–2.0)
Acid-base deficit: 1.6 mmol/L (ref 0.0–2.0)
Bicarbonate: 19.7 mmol/L — ABNORMAL LOW (ref 20.0–28.0)
Bicarbonate: 19.3 mmol/L — ABNORMAL LOW (ref 20.0–28.0)
Bicarbonate: 25.6 mmol/L (ref 20.0–28.0)
FIO2: 0.7
FIO2: 0.7
FIO2: 0.7
MECHVT: 500 mL
MECHVT: 500 mL
MECHVT: 500 mL
Mechanical Rate: 26
Mechanical Rate: 26
O2 Saturation: 96.8 %
O2 Saturation: 88.9 %
O2 Saturation: 92.7 %
PEEP: 10 cmH2O
PEEP: 10 cmH2O
PEEP: 10 cmH2O
Patient temperature: 37
Patient temperature: 37
Patient temperature: 37
RATE: 26 resp/min
RATE: 26 resp/min
RATE: 26 resp/min
pCO2 arterial: 44 mmHg (ref 32.0–48.0)
pCO2 arterial: 44 mmHg (ref 32.0–48.0)
pCO2 arterial: 52 mmHg — ABNORMAL HIGH (ref 32.0–48.0)
pH, Arterial: 7.26 — ABNORMAL LOW (ref 7.350–7.450)
pH, Arterial: 7.25 — ABNORMAL LOW (ref 7.350–7.450)
pH, Arterial: 7.3 — ABNORMAL LOW (ref 7.350–7.450)
pO2, Arterial: 101 mmHg (ref 83.0–108.0)
pO2, Arterial: 66 mmHg — ABNORMAL LOW (ref 83.0–108.0)
pO2, Arterial: 73 mmHg — ABNORMAL LOW (ref 83.0–108.0)

## 2021-06-15 LAB — LACTIC ACID, PLASMA
Lactic Acid, Venous: 4.4 mmol/L (ref 0.5–1.9)
Lactic Acid, Venous: 4.7 mmol/L (ref 0.5–1.9)
Lactic Acid, Venous: 4.4 mmol/L (ref 0.5–1.9)

## 2021-06-15 LAB — POTASSIUM
Potassium: 4.1 mmol/L (ref 3.5–5.1)
Potassium: 4.9 mmol/L (ref 3.5–5.1)
Potassium: 4.8 mmol/L (ref 3.5–5.1)

## 2021-06-15 LAB — RESPIRATORY PANEL BY PCR

## 2021-06-15 LAB — COMPREHENSIVE METABOLIC PANEL
ALT: 80 U/L — ABNORMAL HIGH (ref 0–44)
AST: 113 U/L — ABNORMAL HIGH (ref 15–41)
Albumin: 3.8 g/dL (ref 3.5–5.0)
Alkaline Phosphatase: 56 U/L (ref 38–126)
Anion gap: 8 (ref 5–15)
BUN: 14 mg/dL (ref 6–20)
CO2: 23 mmol/L (ref 22–32)
Calcium: 9.1 mg/dL (ref 8.9–10.3)
Chloride: 105 mmol/L (ref 98–111)
Creatinine, Ser: 1.83 mg/dL — ABNORMAL HIGH (ref 0.61–1.24)
GFR, Estimated: 51 mL/min — ABNORMAL LOW (ref >60)
Glucose, Bld: 181 mg/dL — ABNORMAL HIGH (ref 70–99)
Potassium: 4.3 mmol/L (ref 3.5–5.1)
Sodium: 136 mmol/L (ref 135–145)
Total Bilirubin: 0.5 mg/dL (ref 0.3–1.2)
Total Protein: 7.3 g/dL (ref 6.5–8.1)

## 2021-06-15 LAB — HEPARIN LEVEL (UNFRACTIONATED)
Heparin Unfractionated: 0.1 IU/mL — ABNORMAL LOW (ref 0.30–0.70)
Heparin Unfractionated: 0.19 IU/mL — ABNORMAL LOW (ref 0.30–0.70)

## 2021-06-15 LAB — PROCALCITONIN
Procalcitonin: 4.96 ng/mL
Procalcitonin: 37.45 ng/mL

## 2021-06-15 LAB — BASIC METABOLIC PANEL
Anion gap: 12 (ref 5–15)
BUN: 16 mg/dL (ref 6–20)
CO2: 21 mmol/L — ABNORMAL LOW (ref 22–32)
Calcium: 8.5 mg/dL — ABNORMAL LOW (ref 8.9–10.3)
Chloride: 106 mmol/L (ref 98–111)
Creatinine, Ser: 1.66 mg/dL — ABNORMAL HIGH (ref 0.61–1.24)
GFR, Estimated: 58 mL/min — ABNORMAL LOW (ref >60)
Glucose, Bld: 138 mg/dL — ABNORMAL HIGH (ref 70–99)
Potassium: 5.8 mmol/L — ABNORMAL HIGH (ref 3.5–5.1)
Sodium: 139 mmol/L (ref 135–145)

## 2021-06-15 LAB — PHOSPHORUS
Phosphorus: 2.1 mg/dL — ABNORMAL LOW (ref 2.5–4.6)
Phosphorus: 3.6 mg/dL (ref 2.5–4.6)

## 2021-06-15 LAB — MAGNESIUM: Magnesium: 2.7 mg/dL — ABNORMAL HIGH (ref 1.7–2.4)

## 2021-06-15 LAB — URINALYSIS, COMPLETE (UACMP) WITH MICROSCOPIC
Bilirubin Urine: NEGATIVE
Glucose, UA: 100 mg/dL — AB
Ketones, ur: NEGATIVE mg/dL
Leukocytes,Ua: NEGATIVE
Nitrite: NEGATIVE
Protein, ur: >300 mg/dL — AB
RBC / HPF: >50 RBC/hpf — ABNORMAL HIGH (ref 0–5)
Specific Gravity, Urine: 1.015 (ref 1.005–1.030)
pH: 7 (ref 5.0–8.0)

## 2021-06-15 LAB — CBC
HCT: 44.1 % (ref 39.0–52.0)
Hemoglobin: 14.4 g/dL (ref 13.0–17.0)
MCH: 30.3 pg (ref 26.0–34.0)
MCHC: 32.7 g/dL (ref 30.0–36.0)
MCV: 92.8 fL (ref 80.0–100.0)
Platelets: 327 10*3/uL (ref 150–400)
RBC: 4.75 MIL/uL (ref 4.22–5.81)
RDW: 13.8 % (ref 11.5–15.5)
WBC: 20.1 10*3/uL — ABNORMAL HIGH (ref 4.0–10.5)
nRBC: 0.1 % (ref 0.0–0.2)

## 2021-06-15 LAB — TROPONIN I (HIGH SENSITIVITY)
Troponin I (High Sensitivity): 657 ng/L (ref <18)
Troponin I (High Sensitivity): 785 ng/L (ref <18)
Troponin I (High Sensitivity): 317 ng/L (ref <18)
Troponin I (High Sensitivity): 317 ng/L (ref <18)

## 2021-06-15 LAB — GLUCOSE, CAPILLARY
Glucose-Capillary: 157 mg/dL — ABNORMAL HIGH (ref 70–99)
Glucose-Capillary: 141 mg/dL — ABNORMAL HIGH (ref 70–99)
Glucose-Capillary: 114 mg/dL — ABNORMAL HIGH (ref 70–99)
Glucose-Capillary: 139 mg/dL — ABNORMAL HIGH (ref 70–99)
Glucose-Capillary: 156 mg/dL — ABNORMAL HIGH (ref 70–99)
Glucose-Capillary: 136 mg/dL — ABNORMAL HIGH (ref 70–99)
Glucose-Capillary: 147 mg/dL — ABNORMAL HIGH (ref 70–99)

## 2021-06-15 LAB — MRSA NEXT GEN BY PCR, NASAL: MRSA by PCR Next Gen: NOT DETECTED

## 2021-06-15 LAB — URINE DRUG SCREEN, QUALITATIVE (ARMC ONLY)
Amphetamines, Ur Screen: NOT DETECTED
Barbiturates, Ur Screen: NOT DETECTED
Benzodiazepine, Ur Scrn: POSITIVE — AB
Cannabinoid 50 Ng, Ur ~~LOC~~: NOT DETECTED
Cocaine Metabolite,Ur ~~LOC~~: NOT DETECTED
MDMA (Ecstasy)Ur Screen: NOT DETECTED
Methadone Scn, Ur: NOT DETECTED
Opiate, Ur Screen: NOT DETECTED
Phencyclidine (PCP) Ur S: NOT DETECTED
Tricyclic, Ur Screen: NOT DETECTED

## 2021-06-15 LAB — TRIGLYCERIDES: Triglycerides: 138 mg/dL (ref <150)

## 2021-06-15 LAB — PROTIME-INR
INR: 1.2 (ref 0.8–1.2)
Prothrombin Time: 14.7 seconds (ref 11.4–15.2)

## 2021-06-15 LAB — APTT: aPTT: 28 seconds (ref 24–36)

## 2021-06-15 LAB — HEMOGLOBIN A1C
Hgb A1c MFr Bld: 6.6 % — ABNORMAL HIGH (ref 4.8–5.6)
Mean Plasma Glucose: 142.72 mg/dL

## 2021-06-15 LAB — STREP PNEUMONIAE URINARY ANTIGEN: Strep Pneumo Urinary Antigen: NEGATIVE

## 2021-06-15 LAB — HIV ANTIBODY (ROUTINE TESTING W REFLEX): HIV Screen 4th Generation wRfx: NONREACTIVE

## 2021-06-15 MED ORDER — DEXTROSE 50 % IV SOLN
1.0000 | INTRAVENOUS | Status: DC
  Filled 2021-06-15: qty 50

## 2021-06-15 MED ORDER — HEPARIN BOLUS VIA INFUSION
3100.0000 [IU] | Freq: Once | INTRAVENOUS | Status: AC
  Administered 2021-06-15: 3100 [IU] via INTRAVENOUS
  Filled 2021-06-15: qty 3100

## 2021-06-15 MED ORDER — SODIUM ZIRCONIUM CYCLOSILICATE 5 G PO PACK
10.0000 g | PACK | Freq: Every day | ORAL | Status: DC
  Administered 2021-06-15: 10 g
  Filled 2021-06-15: qty 2

## 2021-06-15 MED ORDER — SODIUM CHLORIDE 0.9 % IV SOLN: INTRAVENOUS | Status: AC

## 2021-06-15 MED ORDER — SODIUM PHOSPHATES 45 MMOLE/15ML IV SOLN
30.0000 mmol | Freq: Once | INTRAVENOUS | Status: AC
  Administered 2021-06-15: 30 mmol via INTRAVENOUS
  Filled 2021-06-15: qty 10

## 2021-06-15 MED ORDER — HEPARIN BOLUS VIA INFUSION
3000.0000 [IU] | Freq: Once | INTRAVENOUS | Status: AC
  Administered 2021-06-15: 3000 [IU] via INTRAVENOUS
  Filled 2021-06-15: qty 3000

## 2021-06-15 MED ORDER — CHLORHEXIDINE GLUCONATE 0.12% ORAL RINSE (MEDLINE KIT)
15.0000 mL | Freq: Two times a day (BID) | OROMUCOSAL | Status: DC
  Administered 2021-06-15 – 2021-06-17 (×5): 15 mL via OROMUCOSAL

## 2021-06-15 MED ORDER — INSULIN ASPART 100 UNIT/ML IJ SOLN
0.0000 [IU] | INTRAMUSCULAR | Status: DC
  Administered 2021-06-15 (×4): 2 [IU] via SUBCUTANEOUS
  Administered 2021-06-15: 3 [IU] via SUBCUTANEOUS
  Administered 2021-06-16 – 2021-06-17 (×3): 2 [IU] via SUBCUTANEOUS
  Filled 2021-06-15 (×8): qty 1

## 2021-06-15 MED ORDER — SODIUM CHLORIDE 0.9 % IV BOLUS
1000.0000 mL | Freq: Once | INTRAVENOUS | Status: AC
  Administered 2021-06-15: 1000 mL via INTRAVENOUS

## 2021-06-15 MED ORDER — ORAL CARE MOUTH RINSE
15.0000 mL | OROMUCOSAL | Status: DC
  Administered 2021-06-15 – 2021-06-17 (×21): 15 mL via OROMUCOSAL

## 2021-06-15 MED ORDER — HEPARIN (PORCINE) 25000 UT/250ML-% IV SOLN
2150.0000 [IU]/h | INTRAVENOUS | Status: DC
  Administered 2021-06-15: 1300 [IU]/h via INTRAVENOUS
  Administered 2021-06-15: 1650 [IU]/h via INTRAVENOUS
  Administered 2021-06-16: 2150 [IU]/h via INTRAVENOUS
  Filled 2021-06-15 (×3): qty 250

## 2021-06-15 MED ORDER — SODIUM BICARBONATE 8.4 % IV SOLN
150.0000 meq | Freq: Once | INTRAVENOUS | Status: AC
  Administered 2021-06-15: 150 meq via INTRAVENOUS
  Filled 2021-06-15: qty 50

## 2021-06-15 MED ORDER — LEVETIRACETAM IN NACL 1000 MG/100ML IV SOLN
1000.0000 mg | Freq: Two times a day (BID) | INTRAVENOUS | Status: DC
  Administered 2021-06-15 – 2021-06-17 (×4): 1000 mg via INTRAVENOUS
  Filled 2021-06-15 (×6): qty 100

## 2021-06-15 MED ORDER — CHLORHEXIDINE GLUCONATE CLOTH 2 % EX PADS
6.0000 | MEDICATED_PAD | Freq: Every day | CUTANEOUS | Status: DC
  Administered 2021-06-15 – 2021-06-16 (×2): 6 via TOPICAL

## 2021-06-15 MED ORDER — INSULIN ASPART 100 UNIT/ML IV SOLN
10.0000 [IU] | INTRAVENOUS | Status: DC
  Filled 2021-06-15: qty 0.1

## 2021-06-15 MED ORDER — HEPARIN BOLUS VIA INFUSION
4000.0000 [IU] | Freq: Once | INTRAVENOUS | Status: AC
  Administered 2021-06-15: 4000 [IU] via INTRAVENOUS
  Filled 2021-06-15: qty 4000

## 2021-06-15 MED ORDER — MIDAZOLAM HCL 2 MG/2ML IJ SOLN: 2.0000 mg | Freq: Once | INTRAMUSCULAR | Status: DC

## 2021-06-15 NOTE — Consult Note (Signed)
ANTICOAGULATION CONSULT NOTE - Consult  Pharmacy Consult for heparin gtt Indication: chest pain/ACS post CA.  No Known Allergies  Patient Measurements: Weight: (!) 157.3 kg (346 lb 12.5 oz) Heparin Dosing Weight: 109.1kg  Vital Signs: BP: 109/67 (08/21 2145) Pulse Rate: 126 (08/21 2145)  Labs: Recent Labs    06/02/2021 2029 06/07/2021 2231  HGB 15.5  --   HCT 48.2  --   PLT 403*  --   CREATININE 1.56*  --   TROPONINIHS 118* 476*    Estimated Creatinine Clearance: 105.9 mL/min (A) (by C-G formula based on SCr of 1.56 mg/dL (H)).   Medications: NKDA PTA: no AC/APT PTA Inpatient: +heparin gtt Heparin Dosing Weight: 109.1kg  Assessment: 25yo male brought to ED in acute respiratory distress, lost pulse, developed asystole then PEA arrest achieving ROSC after 30-minutes of CPR, emergently intubated, and transferred to the ICU. Post-arrest Troponin elevation 118>476 w/ ST changes noted on EKG (Qtc bazett ; VR 155bpm per Fridericia ). Pharmacy consulted for the mgmt of heparin gtt.  Date Time aPTT/HL Rate/Comment       Baseline Labs: aPTT - ordered/pending INR - ordered/pending Hgb - 15.5 Plts - 403  Goal of Therapy:  Heparin level 0.3-0.7 units/ml Monitor platelets by anticoagulation protocol: Yes   Plan:  Pt had prolonged CA with CPR 30 minutes, watch labs closely for any s/sy of bleeding and CTM troponin trends. Give 4000 units bolus x1; start heparin infusion at 1300 units/hr Check anti-Xa level in 6 hours and daily while on heparin Continue to monitor H&H and platelets  Martyn Malay 06/15/2021,12:06 AM

## 2021-06-15 NOTE — Progress Notes (Signed)
PHARMACY CONSULT NOTE - FOLLOW UP  Pharmacy Consult for Electrolyte Monitoring and Replacement   Recent Labs: Potassium (mmol/L)  Date Value  06/15/2021 4.9   Magnesium (mg/dL)  Date Value  10/62/6948 2.7 (H)   Calcium (mg/dL)  Date Value  54/62/7035 9.1   Albumin (g/dL)  Date Value  00/93/8182 3.8   Phosphorus (mg/dL)  Date Value  99/37/1696 2.1 (L)   Sodium (mmol/L)  Date Value  06/15/2021 136     Assessment: Patient with cardiac arrest and severe acidosis. Currently on vent and vasopressors. Magnesium remains slightly elevated ,but continues to improved since 3.2 last night. Potassium at desired goal range and phosp below goal.  Goal of Therapy:  Electrolytes within normal limits  Plan:  - Replace phos with Sodium Phos over 6 hrs. - continue to monitor with AM labs  Alara Daniel Rodriguez-Guzman PharmD, BCPS 06/15/2021 8:33 AM

## 2021-06-15 NOTE — Progress Notes (Signed)
ANTICOAGULATION CONSULT NOTE  Pharmacy Consult for heparin gtt Indication: chest pain/ACS post CA.  No Known Allergies  Patient Measurements: Height: 5\' 9"  (175.3 cm) Weight: (!) 154.4 kg (340 lb 6.2 oz) IBW/kg (Calculated) : 70.7 Heparin Dosing Weight: 109.1kg  Vital Signs: Temp: 98.2 F (36.8 C) (08/22 1700) Temp Source: Oral (08/22 0800) BP: 117/59 (08/22 1700) Pulse Rate: 96 (08/22 1700)  Labs: Recent Labs    06/16/2021 2029 06/09/2021 2231 06/15/21 0038 06/15/21 0133 06/15/21 0437 06/15/21 0638 06/15/21 1309 06/15/21 1513 06/15/21 1701  HGB 15.5  --   --   --  14.4  --   --   --   --   HCT 48.2  --   --   --  44.1  --   --   --   --   PLT 403*  --   --   --  327  --   --   --   --   APTT  --   --  28  --   --   --   --   --   --   LABPROT  --   --  14.7  --   --   --   --   --   --   INR  --   --  1.2  --   --   --   --   --   --   HEPARINUNFRC  --   --   --   --   --  0.10*  --   --  0.19*  CREATININE 1.56*  --   --  1.83*  --   --  1.66*  --   --   TROPONINIHS 118*   < >  --  657* 785*  --  317* 317*  --    < > = values in this interval not displayed.     Estimated Creatinine Clearance: 98.5 mL/min (A) (by C-G formula based on SCr of 1.66 mg/dL (H)).   Medications: NKDA PTA: no AC/APT PTA Inpatient: +heparin gtt Heparin Dosing Weight: 109.1kg  Assessment: 25yo male brought to ED in acute respiratory distress, lost pulse, developed asystole then PEA arrest achieving ROSC after 30-minutes of CPR, emergently intubated, and transferred to the ICU. Post-arrest Troponin elevation 118>476 w/ ST changes noted on EKG (Qtc bazett 06/17/21; VR 155bpm per Fridericia ). Pharmacy consulted for the mgmt of heparin gtt.  Baseline Labs: Hgb - 15.5 Plts - 403  Goal of Therapy:  Heparin level 0.3-0.7 units/ml Monitor platelets by anticoagulation protocol: Yes   Plan:  HL subtherapeutic Give 3100 unit bolus Increase heparin infusion to 1950 units/hr Recheck HL  8/23 at 0100 Continue to monitor H&H and platelets  9/23, PharmD, BCPS Clinical Pharmacist 06/15/2021 5:46 PM

## 2021-06-15 NOTE — Progress Notes (Signed)
Eeg done 

## 2021-06-15 NOTE — Progress Notes (Signed)
   06/15/21 1025  Clinical Encounter Type  Visited With Patient and family together  Visit Type Initial;Spiritual support;Social support  Referral From Chaplain  Consult/Referral To Chaplain  Stress Factors  Family Stress Factors Loss of control;Health changes   Chaplain Raney Koeppen visited with PT and his brother who he met in the hallway. PT's brother stated that his brother was the youngest of 2. PT lives locally, whereas most of his family lives in the [place] area. Chaplain ministered with compassionate presence, reflective listing, and provided space for expression of emotions. [name] (PT's brother) spoke of PT having a history of asthma, but never had this severe outcome. Chaplain will follow back up, to visit with PT's mother, who will be coming later in the day.

## 2021-06-15 NOTE — Progress Notes (Signed)
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  Continue current treatment plan  Will repeat EEG tomorrow  Will decide on transfer for continuous EEG once he is rewarmed    -- Milon Dikes, MD Neurologist [facility] Pager: [phone]

## 2021-06-15 NOTE — Procedures (Signed)
Central Venous Catheter Insertion Procedure Note  Edward Jennings  [id number]  1996-05-20  Date:06/15/21  Time:12:03 AM   Provider Performing:Ashby Leflore L Rust-Chester   Procedure: Insertion of Non-tunneled Central Venous [id number]) with US guidance (26333)   Indication(s) Medication administration  Consent Risks of the procedure as well as the alternatives and risks of each were explained to the patient and/or caregiver.  Consent for the procedure was obtained and is signed in the bedside chart  Anesthesia Topical only with 1% lidocaine , propofol & fentanyl drips in place  Timeout Verified patient identification, verified procedure, site/side was marked, verified correct patient position, special equipment/implants available, medications/allergies/relevant history reviewed, required imaging and test results available.  Sterile Technique Maximal sterile technique including full sterile barrier drape, hand hygiene, sterile gown, sterile gloves, mask, hair covering, sterile ultrasound probe cover (if used).  Procedure Description Area of catheter insertion was cleaned with chlorhexidine and draped in sterile fashion.  With real-time ultrasound guidance a central venous catheter was placed into the left internal jugular vein. Nonpulsatile blood flow and easy flushing noted in all ports.  The catheter was sutured in place and sterile dressing applied.  Complications/Tolerance None; patient tolerated the procedure well. Chest X-ray is ordered to verify placement for internal jugular or subclavian cannulation.   Chest x-ray is not ordered for femoral cannulation.  EBL Minimal  Specimen(s) None  Edward Jennings, AGACNP-BC Acute Care Nurse Practitioner [facility]   [phone] / [phone] Please see Amion for pager details.

## 2021-06-15 NOTE — Progress Notes (Signed)
NAME:  Edward Jennings, MRN:  [id number], DOB:  09/10/1996, LOS: 1 ADMISSION DATE:  06/24/2021, CONSULTATION DATE:  06/20/2021 REFERRING MD:  Dr. Cheri Fowler, CHIEF COMPLAINT:  cardiac/respiratory arrest   History of Present Illness:  25 yo M presenting to [facility] ED via EMS from home in respiratory distress. Per EDP handoff the patient received Duo-neb and 125 mg of Solu-medrol with EMS, who placed the patient on CPAP for respiratory support. EMS reported on arrival to ED that the patient became severely agitated en route, throwing EMS personnel across the truck" ED course: Upon arrival to [facility] ED patient was unresponsive & in respiratory distress, then shortly after arrival lost pulses. Initial rhythm was asystole then PEA, he received CPR for 30 minutes including multiple rounds of epinephrine and initiation of a levophed drip. Patient was emergently intubated requiring mechanical ventilatory support. Initial vitals: 37.5, tachypneic 23, tachycardic 138, BP 90/48 (61) on levophed drip & SPO2 97% on 100% FiO2 Significant Labs: hyperkalemia- 6.8, AKI- BUN/Cr: 11/ 1.56, mild Transaminitis: AST/ALT- 122/82, elevated troponin: 118, BNP- 43.3, lactic acidosis- 7.5, leukocytosis- 35.1, severe respiratory acidosis - ABG: < 6.9/ >120/ 115/ unreadable. Labs/ Imaging personally reviewed CT head wo contrast 05/31/2021 > no acute intracranial abnormality. CT angio chest 05/26/2021 > negative for PE, patchy areas of infiltration/ atelectasis bilaterally with focal consolidation & volume loss in the lower lungs. CXR 06/24/2021> mild perihilar infiltrate  I, Domingo Pulse Rust-Chester, AGACNP-BC, personally viewed and interpreted this ECG. EKG Interpretation Date: 06/11/2021 EKG Time: 19:43 Rate: 155 Rhythm: Sinus Tachycardia QRS Axis: borderline LAD Intervals: prolonged Qtc- 535 ST/T Wave abnormalities: diffuse ST depression Narrative Interpretation: ST with signs of ischemia and prolonged Qtc  Patient lives with his older  brother, [name], who I reached by phone with his other brother- [name]'s assistance.  [name] reported that he has heard the patient using his nebulizer machine in the middle of night frequently over the last 2 weeks. [name] denied anyone else in the household as being ill. He was not sure about signs/symptoms of illness, but reported that the patient was not acting differently and did not have any complaints recently.  Earlier this evening, 06/02/2021, [name] stated his brother called him on the phone from inside the house asking him to call the ambulance because he couldn't breathe. [name] reported that when he found the patient he was on his knees leaning on the bed for support and was so short of breath that he "stopped talking". [name] stated that the patient does NOT smoke cigarettes/use tobacco products, drinks socially only & denies any other recreational drug use.  PCCM consulted for admission to ICU. Pertinent  Medical History  Asthma Obesity  Significant Hospital Events: Including procedures, antibiotic start and stop dates in addition to other pertinent events   05/25/2021: Arrived in respiratory distress, in ED lost pulses- asystole then PEA, CPR for 30 minutes prior to ROSC- emergently intubated and admitted to ICU with normothermia protocol 8/22 REMAINS CRITICALLY ILL ON VENT  Interim History / Subjective:  REMAINS ON VENT REMAINS CRITICALLY ILL SEVERE HYPOXIA Vent Mode: PRVC FiO2 (%):  [70 %-100 %] 70 % Set Rate:  [26 bmp] 26 bmp Vt Set:  [500 mL] 500 mL PEEP:  [10 cmH20] 10 cmH20 Plateau Pressure:  [26 cmH20-30 cmH20] 30 cmH20   Objective   Blood pressure 119/84, pulse 86, temperature (!) 97.2 F (36.2 C), resp. rate (!) 23, height '5\' 9"'  (1.753 m), weight (!) 154.4 kg, SpO2 98 %.  Vent Mode: PRVC FiO2 (%):  [70 %-100 %] 70 % Set Rate:  [26 bmp] 26 bmp Vt Set:  [500 mL] 500 mL PEEP:  [10 cmH20] 10 cmH20 Plateau Pressure:  [26 cmH20-30 cmH20] 30 cmH20   Intake/Output Summary  (Last 24 hours) at 06/15/2021 0729 Last data filed at 06/15/2021 1610 Gross per 24 hour  Intake 3370.46 ml  Output 825 ml  Net 2545.46 ml   Filed Weights   06/07/2021 2002 06/15/21 0405  Weight: (!) 157.3 kg (!) 154.4 kg   REVIEW OF SYSTEMS  PATIENT IS UNABLE TO PROVIDE COMPLETE REVIEW OF SYSTEMS DUE TO SEVERE CRITICAL ILLNESS AND TOXIC METABOLIC ENCEPHALOPATHY  PHYSICAL EXAMINATION:  GENERAL:critically ill appearing, +resp distress EYES: Pupils equal, round, reactive to light.  No scleral icterus.  MOUTH: Moist mucosal membrane. INTUBATED NECK: Supple.  PULMONARY: +rhonchi, +wheezing CARDIOVASCULAR: S1 and S2.  No murmurs  GASTROINTESTINAL: Soft, nontender, -distended. Positive bowel sounds.  MUSCULOSKELETAL: No swelling, clubbing, or edema.  NEUROLOGIC: obtunded SKIN:intact,warm,dry   Assessment & Plan:  25 YO MORBIDLY OBESE AAM ADMITTED FOR SEVERE AND ACUTE RESPIRATORY FAILURE WITH ACUTE AND SEEVERE ASTHMA EXACERBATION LEADING TO SUDDEN CARDIAC ARREST AND DEATH  Severe ACUTE Hypoxic and Hypercapnic Respiratory Failure -continue Mechanical Ventilator support -continue Bronchodilator Therapy -Wean Fio2 and PEEP as tolerated -VAP/VENT bundle implementation -will NOT perform SAT/SBT when respiratory parameters are met    SEVERE asthma EXACERBATION -continue IV steroids as prescribed -continue NEB THERAPY as prescribed -morphine as needed -wean fio2 as needed and tolerated  ACUTE  CARDIAC FAILURE- NSTEMI -oxygen as needed -Lasix as tolerated -follow up cardiac enzymes as indicated Cardiac arrest secondary to respiratory arrest in the setting of asthma exacerbation   Circulatory shock Elevated Troponin secondary to demand ischemia vs N-STEMI  QTc prolongation  Start Normothermia protocol, if patient temp > 37.6, goal 37 degrees   CARDIOGENIC  shock -use vasopressors to keep MAP>65 as needed - Continue vasopressors: switch levophed to neo-synephrine d/t tachycardia,  wean as tolerated to maintain MAP > 65 - Echocardiogram ordered   SEVERE ACIDOSIS DUE TO CARDIOGENIC SHOCK Lactic Acidosis in the setting of cardiac arrest    NEUROLOGY ACUTE TOXIC METABOLIC ENCEPHALOPATHY -need for sedation -Goal RASS -2 to -3 HYPOTHERMIA PROTOCOL - Neuro consulted, appreciate input - loaded with Keppra due to full body myoclonic jerking, continue Keppra IV Q 12 h     ACUTE KIDNEY INJURY/Renal Failure -continue Foley Catheter-assess need -Avoid nephrotoxic agents -Follow urine output, BMP -Ensure adequate renal perfusion, optimize oxygenation -Renal dose medications   Intake/Output Summary (Last 24 hours) at 06/15/2021 0734 Last data filed at 06/15/2021 9604 Gross per 24 hour  Intake 3370.46 ml  Output 825 ml  Net 2545.46 ml     GI GI PROPHYLAXIS as indicated NUTRITIONAL STATUS DIET-->TF's as tolerated Constipation protocol as indicated Mild Transaminitis in the setting of cardiac arrest - Trend hepatic function - avoid hepatotoxic agents   ENDO - ICU hypoglycemic\Hyperglycemia protocol -check FSBS per protocol  ELECTROLYTES -follow labs as needed -replace as needed -pharmacy consultation and following   Best Practice (right click and "Reselect all SmartList Selections" daily)  Diet/type: NPO w/ meds via tube DVT prophylaxis: prophylactic heparin  GI prophylaxis: PPI Lines: N/A- consent obtained for CVC placement Foley:  Yes, and it is still needed Code Status:  full code Last date of multidisciplinary goals of care discussion [06/21/2021] ICU  STATUS   Labs   CBC: Recent Labs  Lab 06/04/2021 2029 06/15/21 0437  WBC 35.1*  20.1*  NEUTROABS 17.6*  --   HGB 15.5 14.4  HCT 48.2 44.1  MCV 93.8 92.8  PLT 403* 425    Basic Metabolic Panel: Recent Labs  Lab 06/15/2021 2029 06/13/2021 2231 06/15/21 0038 06/15/21 0133 06/15/21 0437 06/15/21 0638  NA 135  --   --  136  --   --   K 6.8*  --  4.1 4.3  --  4.9  CL 99  --   --  105   --   --   CO2 24  --   --  23  --   --   GLUCOSE 273*  --   --  181*  --   --   BUN 11  --   --  14  --   --   CREATININE 1.56*  --   --  1.83*  --   --   CALCIUM 9.5  --   --  9.1  --   --   MG  --  3.2*  --   --  2.7*  --   PHOS  --  5.8*  --   --  2.1*  --    GFR: Estimated Creatinine Clearance: 89.4 mL/min (A) (by C-G formula based on SCr of 1.83 mg/dL (H)). Recent Labs  Lab 05/30/2021 2029 06/10/2021 2231 06/15/21 0133 06/15/21 0437  PROCALCITON  --  4.96  --  37.45  WBC 35.1*  --   --  20.1*  LATICACIDVEN 7.5*  --  4.4* 4.7*    Liver Function Tests: Recent Labs  Lab 06/09/2021 2029 06/15/21 0133  AST 122* 113*  ALT 82* 80*  ALKPHOS 114 56  BILITOT 0.5 0.5  PROT 8.2* 7.3  ALBUMIN 4.1 3.8   No results for input(s): LIPASE, AMYLASE in the last 168 hours. No results for input(s): AMMONIA in the last 168 hours.  ABG    Component Value Date/Time   PHART 7.26 (L) 06/15/2021 0500   PCO2ART 44 06/15/2021 0500   PO2ART 101 06/15/2021 0500   HCO3 19.7 (L) 06/15/2021 0500   ACIDBASEDEF 7.3 (H) 06/15/2021 0500   O2SAT 96.8 06/15/2021 0500     Coagulation Profile: Recent Labs  Lab 06/15/21 0038  INR 1.2      DVT/GI PRX  assessed I Assessed the need for Labs I Assessed the need for Foley I Assessed the need for Central Venous Line Family Discussion when available I Assessed the need for Mobilization I made an Assessment of medications to be adjusted accordingly Safety Risk assessment completed  CASE DISCUSSED IN MULTIDISCIPLINARY ROUNDS WITH ICU TEAM     Critical Care Time devoted to patient care services described in this note is 60 minutes.  Critical care was necessary to treat /prevent imminent and life-threatening deterioration. Overall, patient is critically ill, prognosis is guarded.  Patient with Multiorgan failure and at high risk for cardiac arrest and death.    Corrin Parker, M.D.  [facility]  Medical  Director [place] Director [facility] Cardio-Pulmonary Department

## 2021-06-15 NOTE — Progress Notes (Signed)
Patient on ventilator, no major changes  throughout the shift, blood pressure are stable, with no pressure support needed. Patient remained at a negative -4 RAAS per protocols, and on Artic Sun temperature therapy. Sedation was decreased during EEG, patient had some left arm and facial movement.  Patients mom and brother visited, provided updates.

## 2021-06-15 NOTE — Progress Notes (Signed)
GOALS OF CARE DISCUSSION  The Clinical status was relayed to family in detail. Mother and brother at bedside  Updated and notified of patients medical condition.    Patient remains unresponsive and will not open eyes to command.   Patient is having a weak cough and struggling to remove secretions.   Patient with increased WOB and using accessory muscles to breathe Explained to family course of therapy and the modalities    Patient with Progressive multiorgan failure with a very high probablity of a very minimal chance of meaningful recovery despite all aggressive and optimal medical therapy.  PATIENT REMAINS FULL CODE   Family understands the situation.  Family are satisfied with Plan of action and management. All questions answered  Additional CC time 35 mins   Levana Minetti Santiago Glad, M.D.  [facility]  Medical Director [facility] Medical Director [facility] Cardio-Pulmonary Department

## 2021-06-15 NOTE — Consult Note (Signed)
Neurology Consultation  Reason for Consult: Encephalopathy status postcardiac arrest Referring Physician: Dr. Mortimer Fries  CC: Myoclonic jerking and unresponsiveness status post cardiac arrest  History is obtained from: Chart review  HPI: Edward Jennings is a 25 y.o. male past medical history of morbid obesity, asthma, brought into the emergency department yesterday in respiratory distress with EMS who placed him on CPAP for respiratory support after receiving DuoNeb and Solu-Medrol, started become severely agitated in the truck and upon arrival to the ED was unresponsive, in respiratory distress and shortly after lost pulses.  Initial rhythm asystole followed by PEA for which she received CPR for about 30 minutes including multiple rounds of epinephrine and initiation of Levophed drip.  He was emergently intubated.  He was hyperkalemic, had AKI, mild transaminitis and elevated troponin along with lactic acidosis and leukocytosis with blood gases revealing severe respiratory acidosis.  Noncontrast head CT with no acute changes.  CT angio of the chest negative for PE.  Patchy areas of infiltration atelectasis.  He remained unresponsive to painful stimuli.  Had intact cough, gag, corneal and pupillary reflexes on the initial ICU team evaluation.  He appeared to have full body myoclonic jerking with eye-opening without any tracking.  Then started having copious vomiting around the OGT and ETT. He is on hypothermia protocol under heavy sedation this morning with propofol and fentanyl, unable to provide any history He was loaded with Keppra and started with Keppra 1000 mg twice daily Also started on broad-spectrum antibiotic coverage for respiratory infection pneumonia Currently being treated for severe acute hypoxic and hypercapnic respiratory failure along with acute systolic cardiac failure-sudden cardiac arrest and non-STEMI by the ICU team.   ROS:  Unable to obtain due to altered mental status.   Past Medical  History:  Diagnosis Date   Asthma    Obesity    No family history on file.  Social History:   reports that he has never smoked. He has never used smokeless tobacco. He reports current alcohol use. He reports that he does not use drugs.  Medications  Current Facility-Administered Medications:    0.9 %  sodium chloride infusion, 250 mL, Intravenous, Continuous, Rust-Chester, Britton L, NP   0.9 %  sodium chloride infusion, 250 mL, Intravenous, Continuous, Darnelle Bos, RPH, Last Rate: 10 mL/hr at 06/15/21 0101, 25 mL at 06/15/21 0101   [COMPLETED] sodium chloride 0.9 % bolus 1,000 mL, 1,000 mL, Intravenous, Once, Last Rate: 999 mL/hr at 06/15/21 0315, Infusion Verify at 06/15/21 0315 **FOLLOWED BY** 0.9 %  sodium chloride infusion, , Intravenous, Continuous, Rust-Chester, Huel Cote, NP, Last Rate: 150 mL/hr at 06/15/21 0643, Infusion Verify at 06/15/21 0643   cefTRIAXone (ROCEPHIN) 2 g in sodium chloride 0.9 % 100 mL IVPB, 2 g, Intravenous, Q24H, Rust-Chester, Huel Cote, NP, Stopped at 06/15/21 0258   chlorhexidine gluconate (MEDLINE KIT) (PERIDEX) 0.12 % solution 15 mL, 15 mL, Mouth Rinse, BID, Mortimer Fries, Kurian, MD, 15 mL at 06/15/21 0901   Chlorhexidine Gluconate Cloth 2 % PADS 6 each, 6 each, Topical, QHS, Kasa, Kurian, MD   docusate (COLACE) 50 MG/5ML liquid 100 mg, 100 mg, Per Tube, Daily PRN, Schertz, Michele Mcalpine, MD   docusate (COLACE) 50 MG/5ML liquid 100 mg, 100 mg, Per Tube, BID, Rust-Chester, Toribio Harbour L, NP   doxycycline (VIBRAMYCIN) 100 mg in sodium chloride 0.9 % 250 mL IVPB, 100 mg, Intravenous, Q12H, Rust-Chester, Britton L, NP, Stopped at 06/15/21 0239   fentaNYL (SUBLIMAZE) bolus via infusion 50 mcg, 50 mcg, Intravenous,  Q1H PRN, Naaman Plummer, MD   fentaNYL 2520mg in NS 2565m(1035mml) infusion-PREMIX, 25-400 mcg/hr, Intravenous, Continuous, Bradler, EvaVista LawmanD, Last Rate: 12.5 mL/hr at 06/15/21 0643, 125 mcg/hr at 06/15/21 0643   heparin ADULT infusion 100 units/mL (25000  units/250m40m1,650 Units/hr, Intravenous, Continuous, Rodriguez-Guzman, Raquel, RPH-CPP, Last Rate: 16.5 mL/hr at 06/15/21 0920, 1,650 Units/hr at 06/15/21 0920   insulin aspart (novoLOG) injection 0-15 Units, 0-15 Units, Subcutaneous, Q4H, Rust-Chester, Britton L, NP, 2 Units at 06/15/21 07521194pratropium-albuterol (DUONEB) 0.5-2.5 (3) MG/3ML nebulizer solution 3 mL, 3 mL, Nebulization, Q4H, Rust-Chester, Britton L, NP, 3 mL at 06/15/21 0729   levETIRAcetam (KEPPRA) IVPB 1000 mg/100 mL premix, 1,000 mg, Intravenous, Q12H, Rust-Chester, BritToribio HarbourNP   LORazepam (ATIVAN) injection 1 mg, 1 mg, Intravenous, Q6H, Rust-Chester, Britton L, NP, 1 mg at 06/15/21 0745   MEDLINE mouth rinse, 15 mL, Mouth Rinse, 10 times per day, KasaFlora Lipps   methylPREDNISolone sodium succinate (SOLU-MEDROL) 125 mg/2 mL injection 60 mg, 60 mg, Intravenous, Q8H, 60 mg at 06/15/21 0525 **FOLLOWED BY** [START ON 06/16/2021] methylPREDNISolone sodium succinate (SOLU-MEDROL) 40 mg/mL injection 40 mg, 40 mg, Intravenous, Q12H, Rust-Chester, Britton L, NP   midazolam (VERSED) injection 2 mg, 2 mg, Intravenous, Once, Rust-Chester, BritToribio HarbourNP   midazolam (VERSED) injection 2-4 mg, 2-4 mg, Intravenous, Q1H PRN, Rust-Chester, Britton L, NP, 2 mg at 06/15/21 0657   ondansetron (ZOFRAN) injection 4 mg, 4 mg, Intravenous, Q6H PRN, Rust-Chester, BritToribio HarbourNP, 4 mg at 06/20/2021 2116   pantoprazole (PROTONIX) injection 40 mg, 40 mg, Intravenous, QHS, Rust-Chester, Britton L, NP, 40 mg at 05/29/2021 2247   phenylephrine (NEO-SYNEPHRINE) 20 mg in sodium chloride 0.9 % 250 mL (0.08 mg/mL) infusion, 25-200 mcg/min, Intravenous, Titrated, Hicks, Morgan L, RPH   polyethylene glycol (MIRALAX / GLYCOLAX) packet 17 g, 17 g, Per Tube, Daily PRN, Rust-Chester, BritToribio HarbourNP   polyethylene glycol (MIRALAX / GLYCOLAX) packet 17 g, 17 g, Per Tube, Daily, Rust-Chester, Britton L, NP   propofol (DIPRIVAN) 1000 MG/100ML infusion, 0-50 mcg/kg/min,  Intravenous, Continuous, Rust-Chester, Britton L, NP, Last Rate: 14.16 mL/hr at 06/15/21 0643, 15 mcg/kg/min at 06/15/21 0643   sodium phosphate 30 mmol in dextrose 5 % 250 mL infusion, 30 mmol, Intravenous, Once, Rodriguez-Guzman, Raquel, RPH-CPP   vancomycin (VANCOCIN) IVPB 1000 mg/200 mL premix, 1,000 mg, Intravenous, Once **AND** [COMPLETED] vancomycin (VANCOREADY) IVPB 1500 mg/300 mL, 1,500 mg, Intravenous, Once, Rust-Chester, BritHuel Cote, Stopped at 06/15/21 0603   Exam: Current vital signs: BP 107/75   Pulse 95   Temp 98.1 F (36.7 C)   Resp (!) 26   Ht _0  (1.753 m)   Wt (!) 154.4 kg   SpO2 95%   BMI 50.27 kg/m  Vital signs in last 24 hours: Temp:  [82 F (27.8 C)-99.7 F (37.6 C)] 98.1 F (36.7 C) (08/22 0900) Pulse Rate:  [86-145] 95 (08/22 0800) Resp:  [0-30] 26 (08/22 0800) BP: (76-206)/(48-125) 107/75 (08/22 0800) SpO2:  [88 %-100 %] 95 % (08/22 0800) FiO2 (%):  [70 %-100 %] 70 % (08/22 0752) Weight:  [154.4 kg-157.3 kg] 154.4 kg (08/22 0405) General: Sedated intubated HEENT: Normocephalic/atraumatic Lungs: Scattered rales Cardiovascular: Regular rate rhythm Abdomen: Obese, nondistended Extremities warm well perfused Neurological exam Sedated intubated Sedation not lowered because of him becoming extremely asynchronous with the vent, currently on hypothermia protocol. Pupils are 2 mm nonreactive Question extremely sluggish corneal reflexes if any bilaterally. Currently not having any cough or gag with  suctioning but earlier in the day with lower sedation had present corneal and gag reflexes. No spontaneous movements. No myoclonic jerking observed No movement to noxious stimulation  Labs I have reviewed labs in epic and the results pertinent to this consultation are:   CBC    Component Value Date/Time   WBC 20.1 (H) 06/15/2021 0437   RBC 4.75 06/15/2021 0437   HGB 14.4 06/15/2021 0437   HCT 44.1 06/15/2021 0437   PLT 327 06/15/2021 0437   MCV  92.8 06/15/2021 0437   MCH 30.3 06/15/2021 0437   MCHC 32.7 06/15/2021 0437   RDW 13.8 06/15/2021 0437   LYMPHSABS 11.9 (H) 06/11/2021 2029   MONOABS 2.4 (H) 06/11/2021 2029   EOSABS 0.7 (H) 06/12/2021 2029   BASOSABS 0.4 (H) 06/19/2021 2029    CMP     Component Value Date/Time   NA 136 06/15/2021 0133   K 4.9 06/15/2021 0638   CL 105 06/15/2021 0133   CO2 23 06/15/2021 0133   GLUCOSE 181 (H) 06/15/2021 0133   BUN 14 06/15/2021 0133   CREATININE 1.83 (H) 06/15/2021 0133   CALCIUM 9.1 06/15/2021 0133   PROT 7.3 06/15/2021 0133   ALBUMIN 3.8 06/15/2021 0133   AST 113 (H) 06/15/2021 0133   ALT 80 (H) 06/15/2021 0133   ALKPHOS 56 06/15/2021 0133   BILITOT 0.5 06/15/2021 0133   GFRNONAA 51 (L) 06/15/2021 0133   GFRAA >60 11/24/2017 0920   Imaging I have reviewed the images obtained:  CT-head-no acute changes   Assessment:  25 year old male presented to the hospital with respiratory distress, eventually lost pulses, underwent CPR for 30 minutes following which she was emergently intubated and had intact brainstem reflexes at the time and very soon after started exhibiting myoclonic jerking. Currently on hypothermia protocol. 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 It is too early to ascertain whether he has sustained any sort of permanent anoxic brain injury. Exam is also limited due to heavy sedation and hypothermia protocol.  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 Plan discussed with Dr. Mortimer Fries  -- Amie Portland, MD Neurologist [facility] Pager: [phone]  CRITICAL CARE ATTESTATION Performed by: Amie Portland, MD Total critical care time: 39 minutes Critical care  time was exclusive of separately billable procedures and treating other patients and/or supervising APPs/Residents/Students Critical care was necessary to treat or prevent imminent or life-threatening deterioration due to myoclonus, status post cardiac arrest, hypoxic ischemic encephalopathy This patient is critically ill and at significant risk for neurological worsening and/or death and care requires constant monitoring. Critical care was time spent personally by me on the following activities: development of treatment plan with patient and/or surrogate as well as nursing, discussions with consultants, evaluation of patient's response to treatment, examination of patient, obtaining history from patient or surrogate, ordering and performing treatments and interventions, ordering and review of laboratory studies, ordering and review of radiographic studies, pulse oximetry, re-evaluation of patient's condition, participation in multidisciplinary rounds and medical decision making of high complexity in the care of this patient.

## 2021-06-15 NOTE — Procedures (Signed)
Patient Name: Edward Jennings  MRN: [id number]  Epilepsy Attending: Charlsie Quest  Referring Physician/Provider: Dr Milon Dikes Date: 06/15/2021 Duration: 22.27 mins  Patient history: 25 year old male presented to the hospital with respiratory distress, eventually lost pulses, underwent CPR for 30 minutes following which she was emergently intubated and had intact brainstem reflexes at the time and very soon after started exhibiting myoclonic jerking. EEG to evaluate for seizure  Level of alertness: comatose  AEDs during EEG study: LEV, propofol  Technical aspects: This EEG study was done with scalp electrodes positioned according to the 10-20 International system of electrode placement. Electrical activity was acquired at a sampling rate of 500Hz  and reviewed with a high frequency filter of 70Hz  and a low frequency filter of 1Hz . EEG data were recorded continuously and digitally stored.   Description: EEG showed burst suppression pattern with highly epileptiform bursts lasting 1-4 seconds and generalized suppression lasting 2-4 seconds. Hyperventilation and photic stimulation were not performed.     ABNORMALITY - Burst suppression with highly epileptiform bursts, generalized   IMPRESSION: This study showed evidence of epileptogenicity with generalized onset and high potential for seizure recurrence. There is also profound diffuse encephalopathy. 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  Dr was notified.     Edward Jennings 

## 2021-06-15 NOTE — Progress Notes (Signed)
ANTICOAGULATION CONSULT NOTE - Consult  Pharmacy Consult for heparin gtt Indication: chest pain/ACS post CA.  No Known Allergies  Patient Measurements: Height: 5\' 9"  (175.3 cm) Weight: (!) 154.4 kg (340 lb 6.2 oz) IBW/kg (Calculated) : 70.7 Heparin Dosing Weight: 109.1kg  Vital Signs: Temp: 97.7 F (36.5 C) (08/22 0800) Temp Source: Oral (08/22 0800) BP: 107/75 (08/22 0800) Pulse Rate: 95 (08/22 0800)  Labs: Recent Labs    06/17/2021 2029 06/07/2021 2231 06/15/21 0038 06/15/21 0133 06/15/21 0437 06/15/21 0638  HGB 15.5  --   --   --  14.4  --   HCT 48.2  --   --   --  44.1  --   PLT 403*  --   --   --  327  --   APTT  --   --  28  --   --   --   LABPROT  --   --  14.7  --   --   --   INR  --   --  1.2  --   --   --   HEPARINUNFRC  --   --   --   --   --  0.10*  CREATININE 1.56*  --   --  1.83*  --   --   TROPONINIHS 118* 476*  --  657* 785*  --      Estimated Creatinine Clearance: 89.4 mL/min (A) (by C-G formula based on SCr of 1.83 mg/dL (H)).   Medications: NKDA PTA: no AC/APT PTA Inpatient: +heparin gtt Heparin Dosing Weight: 109.1kg  Assessment: 25yo male brought to ED in acute respiratory distress, lost pulse, developed asystole then PEA arrest achieving ROSC after 30-minutes of CPR, emergently intubated, and transferred to the ICU. Post-arrest Troponin elevation 118>476 w/ ST changes noted on EKG (Qtc bazett 06/17/21; VR 155bpm per Fridericia ). Pharmacy consulted for the mgmt of heparin gtt.  Date Time aPTT/HL Rate/Comment 0822  0638   0.10 IU/ml 1300 units/hr  Baseline Labs: Hgb - 15.5 Plts - 403  Goal of Therapy:  Heparin level 0.3-0.7 units/ml Monitor platelets by anticoagulation protocol: Yes   Plan:  Pt had prolonged CA with CPR 30 minutes, watch labs closely for any s/sy of bleeding and CTM troponin trends. Give 3000 units bolus x1; increase heparin infusion at 1650 units/hr Check anti-Xa level in 6 hours and daily while on  heparin Continue to monitor H&H and platelets  Edward Jennings PharmD, BCPS 06/15/2021 9:07 AM

## 2021-06-16 ENCOUNTER — Inpatient Hospital Stay: Payer: Self-pay

## 2021-06-16 ENCOUNTER — Inpatient Hospital Stay
Admit: 2021-06-16 | Discharge: 2021-06-16 | Disposition: A | Discharge status: Home / Self Care | Payer: Self-pay | Attending: Pulmonary Disease | Admitting: Pulmonary Disease

## 2021-06-16 DIAGNOSIS — G931 Anoxic brain damage, not elsewhere classified: Secondary | ICD-10-CM

## 2021-06-16 LAB — ECHOCARDIOGRAM COMPLETE
AR max vel: 2.89 cm2
AV Area VTI: 2.58 cm2
AV Area mean vel: 2.65 cm2
AV Mean grad: 7 mmHg
AV Peak grad: 12.4 mmHg
Ao pk vel: 1.76 m/s
Area-P 1/2: 9.6 cm2
Height: 69 in
MV VTI: 2.99 cm2
S' Lateral: 3.4 cm
Weight: 5446.24 oz

## 2021-06-16 LAB — HEPARIN LEVEL (UNFRACTIONATED)
Heparin Unfractionated: 0.25 IU/mL — ABNORMAL LOW (ref 0.30–0.70)
Heparin Unfractionated: 0.32 IU/mL (ref 0.30–0.70)

## 2021-06-16 LAB — BLOOD GAS, ARTERIAL
Acid-base deficit: 2.8 mmol/L — ABNORMAL HIGH (ref 0.0–2.0)
Bicarbonate: 23.2 mmol/L (ref 20.0–28.0)
FIO2: 0.7
MECHVT: 500 mL
O2 Saturation: 95.5 %
PEEP: 10 cmH2O
Patient temperature: 37
RATE: 26 resp/min
pCO2 arterial: 44 mmHg (ref 32.0–48.0)
pH, Arterial: 7.33 — ABNORMAL LOW (ref 7.350–7.450)
pO2, Arterial: 84 mmHg (ref 83.0–108.0)

## 2021-06-16 LAB — HEPATIC FUNCTION PANEL
ALT: 62 U/L — ABNORMAL HIGH (ref 0–44)
AST: 87 U/L — ABNORMAL HIGH (ref 15–41)
Albumin: 3.3 g/dL — ABNORMAL LOW (ref 3.5–5.0)
Alkaline Phosphatase: 41 U/L (ref 38–126)
Bilirubin, Direct: <0.1 mg/dL (ref 0.0–0.2)
Total Bilirubin: 0.6 mg/dL (ref 0.3–1.2)
Total Protein: 6.5 g/dL (ref 6.5–8.1)

## 2021-06-16 LAB — BASIC METABOLIC PANEL
Anion gap: 8 (ref 5–15)
BUN: 19 mg/dL (ref 6–20)
CO2: 26 mmol/L (ref 22–32)
Calcium: 8 mg/dL — ABNORMAL LOW (ref 8.9–10.3)
Chloride: 107 mmol/L (ref 98–111)
Creatinine, Ser: 1.33 mg/dL — ABNORMAL HIGH (ref 0.61–1.24)
GFR, Estimated: >60 mL/min (ref >60)
Glucose, Bld: 178 mg/dL — ABNORMAL HIGH (ref 70–99)
Potassium: 4.4 mmol/L (ref 3.5–5.1)
Sodium: 141 mmol/L (ref 135–145)

## 2021-06-16 LAB — GLUCOSE, CAPILLARY
Glucose-Capillary: 125 mg/dL — ABNORMAL HIGH (ref 70–99)
Glucose-Capillary: 133 mg/dL — ABNORMAL HIGH (ref 70–99)
Glucose-Capillary: 104 mg/dL — ABNORMAL HIGH (ref 70–99)
Glucose-Capillary: 109 mg/dL — ABNORMAL HIGH (ref 70–99)
Glucose-Capillary: 119 mg/dL — ABNORMAL HIGH (ref 70–99)
Glucose-Capillary: 108 mg/dL — ABNORMAL HIGH (ref 70–99)

## 2021-06-16 LAB — CBC WITH DIFFERENTIAL/PLATELET
Abs Immature Granulocytes: 0.17 10*3/uL — ABNORMAL HIGH (ref 0.00–0.07)
Basophils Absolute: 0 10*3/uL (ref 0.0–0.1)
Basophils Relative: 0 %
Eosinophils Absolute: 0 10*3/uL (ref 0.0–0.5)
Eosinophils Relative: 0 %
HCT: 38.7 % — ABNORMAL LOW (ref 39.0–52.0)
Hemoglobin: 12.6 g/dL — ABNORMAL LOW (ref 13.0–17.0)
Immature Granulocytes: 1 %
Lymphocytes Relative: 8 %
Lymphs Abs: 1.6 10*3/uL (ref 0.7–4.0)
MCH: 29.9 pg (ref 26.0–34.0)
MCHC: 32.6 g/dL (ref 30.0–36.0)
MCV: 91.9 fL (ref 80.0–100.0)
Monocytes Absolute: 1.9 10*3/uL — ABNORMAL HIGH (ref 0.1–1.0)
Monocytes Relative: 9 %
Neutro Abs: 17.6 10*3/uL — ABNORMAL HIGH (ref 1.7–7.7)
Neutrophils Relative %: 82 %
Platelets: 307 10*3/uL (ref 150–400)
RBC: 4.21 MIL/uL — ABNORMAL LOW (ref 4.22–5.81)
RDW: 14.6 % (ref 11.5–15.5)
WBC: 21.3 10*3/uL — ABNORMAL HIGH (ref 4.0–10.5)
nRBC: 0 % (ref 0.0–0.2)

## 2021-06-16 LAB — LACTIC ACID, PLASMA: Lactic Acid, Venous: 1.7 mmol/L (ref 0.5–1.9)

## 2021-06-16 LAB — TROPONIN I (HIGH SENSITIVITY): Troponin I (High Sensitivity): 129 ng/L (ref <18)

## 2021-06-16 LAB — PHOSPHORUS: Phosphorus: 3.6 mg/dL (ref 2.5–4.6)

## 2021-06-16 LAB — MAGNESIUM: Magnesium: 2.4 mg/dL (ref 1.7–2.4)

## 2021-06-16 LAB — LIPASE, BLOOD: Lipase: 33 U/L (ref 11–51)

## 2021-06-16 LAB — AMYLASE: Amylase: 173 U/L — ABNORMAL HIGH (ref 28–100)

## 2021-06-16 LAB — LEGIONELLA PNEUMOPHILA SEROGP 1 UR AG: L. pneumophila Serogp 1 Ur Ag: NEGATIVE

## 2021-06-16 LAB — PROCALCITONIN: Procalcitonin: 21.09 ng/mL

## 2021-06-16 MED ORDER — VITAL HIGH PROTEIN PO LIQD: 1000.0000 mL | ORAL | Status: DC

## 2021-06-16 MED ORDER — DEXAMETHASONE SODIUM PHOSPHATE 10 MG/ML IJ SOLN
6.0000 mg | Freq: Three times a day (TID) | INTRAMUSCULAR | Status: DC
  Administered 2021-06-16 – 2021-06-17 (×6): 6 mg via INTRAVENOUS
  Filled 2021-06-16 (×7): qty 0.6

## 2021-06-16 MED ORDER — METOCLOPRAMIDE HCL 5 MG/ML IJ SOLN
10.0000 mg | Freq: Four times a day (QID) | INTRAMUSCULAR | Status: DC
  Administered 2021-06-16 – 2021-06-17 (×4): 10 mg via INTRAVENOUS
  Filled 2021-06-16 (×4): qty 2

## 2021-06-16 MED ORDER — VECURONIUM BROMIDE 10 MG IV SOLR
20.0000 mg | Freq: Once | INTRAVENOUS | Status: AC
  Administered 2021-06-16: 20 mg via INTRAVENOUS

## 2021-06-16 MED ORDER — DIPHENHYDRAMINE HCL 50 MG/ML IJ SOLN
25.0000 mg | Freq: Four times a day (QID) | INTRAMUSCULAR | Status: AC
  Administered 2021-06-16 – 2021-06-17 (×4): 25 mg via INTRAVENOUS
  Filled 2021-06-16 (×4): qty 1

## 2021-06-16 MED ORDER — PIPERACILLIN-TAZOBACTAM 3.375 G IVPB
3.3750 g | Freq: Three times a day (TID) | INTRAVENOUS | Status: DC
  Administered 2021-06-16 – 2021-06-17 (×2): 3.375 g via INTRAVENOUS
  Filled 2021-06-16 (×2): qty 50

## 2021-06-16 MED ORDER — FREE WATER: 75.0000 mL | Status: DC

## 2021-06-16 MED ORDER — PROSOURCE TF PO LIQD
90.0000 mL | Freq: Two times a day (BID) | ORAL | Status: DC
  Administered 2021-06-16 – 2021-06-17 (×2): 90 mL

## 2021-06-16 MED ORDER — FAMOTIDINE IN NACL 20-0.9 MG/50ML-% IV SOLN
20.0000 mg | Freq: Two times a day (BID) | INTRAVENOUS | Status: DC
  Administered 2021-06-16 – 2021-06-17 (×3): 20 mg via INTRAVENOUS
  Filled 2021-06-16 (×3): qty 50

## 2021-06-16 MED ORDER — HEPARIN BOLUS VIA INFUSION
1600.0000 [IU] | Freq: Once | INTRAVENOUS | Status: AC
  Administered 2021-06-16: 1600 [IU] via INTRAVENOUS
  Filled 2021-06-16: qty 1600

## 2021-06-16 MED ORDER — SODIUM CHLORIDE 0.9 % IV SOLN
3.0000 g | Freq: Three times a day (TID) | INTRAVENOUS | Status: DC
  Administered 2021-06-16: 3 g via INTRAVENOUS
  Filled 2021-06-16 (×2): qty 8
  Filled 2021-06-16: qty 3
  Filled 2021-06-16: qty 8

## 2021-06-16 MED ORDER — PERFLUTREN LIPID MICROSPHERE
1.0000 mL | INTRAVENOUS | Status: AC | PRN
  Administered 2021-06-16: 3 mL via INTRAVENOUS
  Filled 2021-06-16: qty 10

## 2021-06-16 MED ORDER — FUROSEMIDE 10 MG/ML IJ SOLN
40.0000 mg | Freq: Once | INTRAMUSCULAR | Status: AC
  Administered 2021-06-16: 40 mg via INTRAVENOUS
  Filled 2021-06-16: qty 4

## 2021-06-16 NOTE — Progress Notes (Signed)
Updated pts mother and brother who are both at bedside regarding pts condition and current plan of care.  Neurology to update pts family regarding EEG result findings.  Will continue to monitor and assess pt.   Camie Patience, AGNP  Pulmonary/Critical Care Pager [phone] (please enter 7 digits) PCCM Consult Pager [phone] (please enter 7 digits)

## 2021-06-16 NOTE — Progress Notes (Signed)
Initial Nutrition Assessment  DOCUMENTATION CODES:  Morbid obesity  INTERVENTION:  Recommend initiation of trickle feeds, advance as able per pt tolerance: Vital High Protein at 18mL/h. Advance to goal rate of 65 ml/h as able (1560 ml total volume per day) Prosource TF 90 ml BID (40kcal and 11g of protein per packet) 61mL free water flush q4h Provides 1720 kcal, 180 gm protein, 1754 ml free water daily  NUTRITION DIAGNOSIS:  Inadequate oral intake related to inability to eat as evidenced by NPO status.  GOAL:  Provide needs based on ASPEN/SCCM guidelines  MONITOR:  TF tolerance, Labs  REASON FOR ASSESSMENT:  Rounds, Ventilator    ASSESSMENT:  25 y.o. male brought to ED from home with respiratory distress. PMH of asthma and brother reports pt has been using nebulizer more often the last few weeks. Upon arrival to ED, pt became unresponsive and CPR was performed for 30 minutes. Emergently intubated and admitted to ICU.   Pt had OGT placed after intubation and had copious amounts of thick secretions suctioned. Pt also begin vomiting around OGT and ETT on admission. Noted that of output recorded in the last 24 hours.   Patient is currently intubated on ventilator support. Discussed in rounds. RN reports that pt vomited last night, but none so far this shift. Per MD, ok to start feeds at a trickle. Reglan also to be initiated to help with gastric motility. EEG performed today, result suggestive of anoxic brain injury. Neurology reports poor prognosis.   MV: 12 L/min Temp (24hrs), Avg:98.6 F (37 C), Min:97.9 F (36.6 C), Max:99.3 F (37.4 C)   Intake/Output Summary (Last 24 hours) at 06/16/2021 1508 Last data filed at 06/16/2021 1310 Gross per 24 hour  Intake 2358.54 ml  Output 3740 ml  Net -1381.46 ml  Net IO Since Admission: 2,794.81 mL [06/16/21 1508]  Scheduled Meds:  dexamethasone (DECADRON) injection  6 mg Intravenous TID   diphenhydrAMINE  25 mg Intravenous Q6H    docusate  100 mg Per Tube BID   metoCLOPramide (REGLAN) injection  10 mg Intravenous Q6H   polyethylene glycol  17 g Per Tube Daily   Continuous Infusions:  fentaNYL infusion INTRAVENOUS 50 mcg/hr (06/16/21 1310)   PRN Meds:.docusate, ondansetron, polyethylene glycol  Labs Reviewed: Creatinine 1.33 SBG ranges from 104-147 mg/dL over the last 24 hours  NUTRITION - FOCUSED PHYSICAL EXAM: Neurology discussing results of EEG with family, deferred assessment at this time. Per visual inspection deficits are not apparent and weight appears to be stable.  Diet Order:   Diet Order             Diet NPO time specified  Diet effective now                   EDUCATION NEEDS:  No education needs have been identified at this time  Skin:  Skin Assessment: Reviewed RN Assessment  Last BM:  8/21  Height:  Ht Readings from Last 1 Encounters:  July 01, 2021 5\' 9"  (1.753 m)    Weight:  Wt Readings from Last 1 Encounters:  06/15/21 (!) 154.4 kg    Ideal Body Weight:  72.7 kg  BMI:  Body mass index is 50.27 kg/m.  Estimated Nutritional Needs:  Kcal:  1599-1818 (22-25 kcal/kg) Protein:  >182g/d (>2.5g/kg) Fluid:  2.2-2.4 L/d   06/17/21, RD, LDN Clinical Dietitian Pager on Amion

## 2021-06-16 NOTE — Procedures (Signed)
Patient Name: Edward Jennings  MRN: [id number]  Epilepsy Attending: Charlsie Quest  Referring Physician/Provider: Dr Milon Dikes Date: 06/16/2021 Duration: 28.64mins   Patient history: 25 year old male presented to the hospital with respiratory distress, eventually lost pulses, underwent CPR for 30 minutes following which she was emergently intubated and had intact brainstem reflexes at the time and very soon after started exhibiting myoclonic jerking. EEG to evaluate for seizure   Level of alertness: comatose   AEDs during EEG study: LEV, propofol   Technical aspects: This EEG study was done with scalp electrodes positioned according to the 10-20 International system of electrode placement. Electrical activity was acquired at a sampling rate of 500Hz  and reviewed with a high frequency filter of 70Hz  and a low frequency filter of 1Hz . EEG data were recorded continuously and digitally stored.    Description: EEG showed continuous generalized background suppression. EEG was not reactive to tactile stimulation.  Hyperventilation and photic stimulation were not performed.      ABNORMALITY -Background suppression, generalized     IMPRESSION: This study is suggestive of profound diffuse encephalopathy. 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  No seizures or epileptiform discharges were seen during this time.   [name] 

## 2021-06-16 NOTE — Progress Notes (Addendum)
PHARMACY CONSULT NOTE - FOLLOW UP  Pharmacy Consult for Electrolyte Monitoring and Replacement   Recent Labs: Potassium (mmol/L)  Date Value  06/16/2021 4.4   Magnesium (mg/dL)  Date Value  44/96/7591 2.4   Calcium (mg/dL)  Date Value  63/84/6659 8.0 (L)   Albumin (g/dL)  Date Value  93/57/0177 3.3 (L)   Phosphorus (mg/dL)  Date Value  93/90/3009 3.6   Sodium (mmol/L)  Date Value  06/16/2021 141     Assessment: Patient with cardiac arrest and severe acidosis. Currently on vent and deep sedation. Corrected calcium at 8.6, all there electrolytes also within normal limits. Noted patient received Lokelma 10gm x 1 dose yesterday at 2153 and prescribed for daily dosing as well.  Goal of Therapy:  Electrolytes within normal limits  Plan:  - Lokelma Dc after consultation with provider (Dr Belia Heman) - No additional intervention at this time - continue to monitor with AM labs  Michoel Kunin Rodriguez-Guzman PharmD, BCPS 06/16/2021 8:20 AM

## 2021-06-16 NOTE — Progress Notes (Signed)
Pharmacy Antibiotic Note  Edward Jennings is a 25 y.o. male admitted on 2021/07/10. Pharmacy has been consulted for Zosyn dosing for aspiration pneumonia.  Plan: Zosyn 3.375 g IV q8h extended infusion  Height: 5\' 9"  (175.3 cm) Weight: (!) 154.4 kg (340 lb 6.2 oz) IBW/kg (Calculated) : 70.7  Temp (24hrs), Avg:98.6 F (37 C), Min:97.9 F (36.6 C), Max:99.3 F (37.4 C)  Recent Labs  Lab 2021-07-10 2029 06/15/21 0133 06/15/21 0437 06/15/21 1309 06/16/21 0447  WBC 35.1*  --  20.1*  --  21.3*  CREATININE 1.56* 1.83*  --  1.66* 1.33*  LATICACIDVEN 7.5* 4.4* 4.7* 4.4*  --     Estimated Creatinine Clearance: 123 mL/min (A) (by C-G formula based on SCr of 1.33 mg/dL (H)).    No Known Allergies  Antimicrobials this admission: Vancomycin 8/21 x 1 Doxycycline 8/21 >> 8/23 Ceftriaxone 8/21 >> 8/23 Unasyn 8/23 x 1 Zosyn 8/23 >>   Microbiology results: 8/22 BCx: NG 8/21 Resp Cx (trach aspirate): normal flora 8/22 Resp Panel: negative 8/22 MRSA PCR: negative  Thank you for allowing pharmacy to be a part of this patient's care.  9/22, PharmD, BCPS Clinical Pharmacist 06/16/2021 4:51 PM

## 2021-06-16 NOTE — Progress Notes (Signed)
Neurology Progress Note   S:// Seen and examined. No acute changes noted Remains completely unresponsive off of propofol since about 10:50 AM.  Still on small dose of fentanyl.   O:// Current vital signs: BP 116/60   Pulse (!) 111   Temp 98.6 F (37 C)   Resp (!) 0   Ht 5' 9" (1.753 m)   Wt (!) 154.4 kg   SpO2 96%   BMI 50.27 kg/m  Vital signs in last 24 hours: Temp:  [97.9 F (36.6 C)-99.3 F (37.4 C)] 98.6 F (37 C) (08/23 1400) Pulse Rate:  [90-123] 111 (08/23 1300) Resp:  [0-26] 0 (08/23 1300) BP: (104-140)/(52-91) 116/60 (08/23 1300) SpO2:  [89 %-100 %] 96 % (08/23 1200) FiO2 (%):  [40 %-70 %] 40 % (08/23 1200) General: Sedated intubated HEENT: Normocephalic/atraumatic Cardiovascular: Regular rhythm Respiratory: Vented Abdomen: Obese, nontender Neurological exam Sedated with minimal fentanyl Intubated No spontaneous movements Breathing with the ventilator Pupils 2 mm nonreactive Very sluggish corneal reflexes present bilaterally No response to noxious stimulation  Medications  Current Facility-Administered Medications:    0.9 %  sodium chloride infusion, 250 mL, Intravenous, Continuous, Rust-Chester, Britton L, NP, Last Rate: 10 mL/hr at 06/15/21 1702, Restarted at 06/15/21 1702   0.9 %  sodium chloride infusion, 250 mL, Intravenous, Continuous, Darnelle Bos, RPH, Stopped at 06/16/21 1306   Ampicillin-Sulbactam (UNASYN) 3 g in sodium chloride 0.9 % 100 mL IVPB, 3 g, Intravenous, Q8H, Kasa, Maretta Bees, MD, Stopped at 06/16/21 1253   chlorhexidine gluconate (MEDLINE KIT) (PERIDEX) 0.12 % solution 15 mL, 15 mL, Mouth Rinse, BID, Mortimer Fries, Kurian, MD, 15 mL at 06/16/21 0715   Chlorhexidine Gluconate Cloth 2 % PADS 6 each, 6 each, Topical, QHS, Kasa, Maretta Bees, MD, 6 each at 06/15/21 2152   dexamethasone (DECADRON) injection 6 mg, 6 mg, Intravenous, TID, Flora Lipps, MD, 6 mg at 06/16/21 1221   diphenhydrAMINE (BENADRYL) injection 25 mg, 25 mg, Intravenous, Q6H, Kasa,  Kurian, MD, 25 mg at 06/16/21 1114   docusate (COLACE) 50 MG/5ML liquid 100 mg, 100 mg, Per Tube, Daily PRN, Jonnie Finner, Michele Mcalpine, MD   docusate (COLACE) 50 MG/5ML liquid 100 mg, 100 mg, Per Tube, BID, Rust-Chester, Toribio Harbour L, NP, 100 mg at 06/16/21 0948   famotidine (PEPCID) IVPB 20 mg premix, 20 mg, Intravenous, Q12H, Kasa, Maretta Bees, MD, Stopped at 06/16/21 1146   fentaNYL (SUBLIMAZE) bolus via infusion 50 mcg, 50 mcg, Intravenous, Q1H PRN, Naaman Plummer, MD   fentaNYL 2532mg in NS 2593m(1074mml) infusion-PREMIX, 25-400 mcg/hr, Intravenous, Continuous, Bradler, EvaVista LawmanD, Last Rate: 5 mL/hr at 06/16/21 1310, 50 mcg/hr at 06/16/21 1310   insulin aspart (novoLOG) injection 0-15 Units, 0-15 Units, Subcutaneous, Q4H, Rust-Chester, Britton L, NP, 2 Units at 06/16/21 0748   ipratropium-albuterol (DUONEB) 0.5-2.5 (3) MG/3ML nebulizer solution 3 mL, 3 mL, Nebulization, Q4H, Rust-Chester, Britton L, NP, 3 mL at 06/16/21 1135   levETIRAcetam (KEPPRA) IVPB 1000 mg/100 mL premix, 1,000 mg, Intravenous, Q12H, Rust-Chester, Britton L, NP, Last Rate: 400 mL/hr at 06/16/21 1310, Infusion Verify at 06/16/21 1310   MEDLINE mouth rinse, 15 mL, Mouth Rinse, 10 times per day, KasFlora LippsD, 15 mL at 06/16/21 1416   metoCLOPramide (REGLAN) injection 10 mg, 10 mg, Intravenous, Q6H, Kasa, Kurian, MD, 10 mg at 06/16/21 1114   midazolam (VERSED) injection 2-4 mg, 2-4 mg, Intravenous, Q1H PRN, Rust-Chester, Britton L, NP, 2 mg at 06/15/21 0657   ondansetron (ZOFRAN) injection 4 mg, 4 mg, Intravenous, Q6H PRN, Rust-Chester, BriToribio Harbour  L, NP, 4 mg at 06/22/2021 2116   polyethylene glycol (MIRALAX / GLYCOLAX) packet 17 g, 17 g, Per Tube, Daily PRN, Rust-Chester, Britton L, NP   polyethylene glycol (MIRALAX / GLYCOLAX) packet 17 g, 17 g, Per Tube, Daily, Rust-Chester, Britton L, NP, 17 g at 06/16/21 0948   propofol (DIPRIVAN) 1000 MG/100ML infusion, 0-50 mcg/kg/min, Intravenous, Continuous, Rust-Chester, Huel Cote, NP, Stopped at  06/16/21 1056 Labs CBC    Component Value Date/Time   WBC 21.3 (H) 06/16/2021 0447   RBC 4.21 (L) 06/16/2021 0447   HGB 12.6 (L) 06/16/2021 0447   HCT 38.7 (L) 06/16/2021 0447   PLT 307 06/16/2021 0447   MCV 91.9 06/16/2021 0447   MCH 29.9 06/16/2021 0447   MCHC 32.6 06/16/2021 0447   RDW 14.6 06/16/2021 0447   LYMPHSABS 1.6 06/16/2021 0447   MONOABS 1.9 (H) 06/16/2021 0447   EOSABS 0.0 06/16/2021 0447   BASOSABS 0.0 06/16/2021 0447    CMP     Component Value Date/Time   NA 141 06/16/2021 0447   K 4.4 06/16/2021 0447   CL 107 06/16/2021 0447   CO2 26 06/16/2021 0447   GLUCOSE 178 (H) 06/16/2021 0447   BUN 19 06/16/2021 0447   CREATININE 1.33 (H) 06/16/2021 0447   CALCIUM 8.0 (L) 06/16/2021 0447   PROT 6.5 06/16/2021 0447   ALBUMIN 3.3 (L) 06/16/2021 0447   AST 87 (H) 06/16/2021 0447   ALT 62 (H) 06/16/2021 0447   ALKPHOS 41 06/16/2021 0447   BILITOT 0.6 06/16/2021 0447   GFRNONAA >60 06/16/2021 0447   GFRAA >60 11/24/2017 0920    Imaging I have reviewed images in epic and the results pertinent to this consultation are: CT of the brain on arrival-no acute changes per initial reading. Second look on the images with questionable effacement of the sulci diffusely.  Repeat EEG today with continuous generalized background suppression with nonreactive EEG to tactile stimulation suggestive of profound diffuse encephalopathy-in the cardiac arrest pattern is suggestive of anoxic/hypoxic brain injury.  No seizures or epileptiform activity seen   Assessment:  25 year old man, history of asthma presented with respiratory distress eventually lost pulses with PEA cardiac arrest underwent 30 minutes of CPR following which he had emergent intubation with reportedly intact brainstem reflexes at that time and started exhibiting myoclonic jerking soon after.  Required heavy sedation due to vent dyssynchrony. Has been off of propofol for about 2 or so hours prior to this  examination. Today's examination with minimal brainstem reflexes EEG also has changed from exhibiting highly epileptiform burst yesterday to complete generalized background suppression today with no reaction to noxious stimulation Clinical history, examination, myoclonus very early after cardiac arrest and EEG findings suggestive of possible hypoxic/anoxic brain injury.  With this poor exam, he might progress to brain death in the next few hours to days.   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 Continue Keppra at current dose. I will examine him again in the morning. Overnight-if he were to have deterioration where his pupils become fixed and dilated or he has clinically started to exhibit Cushing's triad, consider stat CT head to look for objective evidence of herniation. I will consider an MRI tomorrow-his exam is very poor and an MRI would probably show diffuse anoxic brain injury and help the family understand the extent of his injury. I did discuss this in detail with the brother and mother at the bedside.  His mother was  very emotional and started crying, as expected but the brother was trying to absorb all the information and held it altogether. I will follow-up again with you tomorrow. I discussed the plan with Dr. Mortimer Fries and the PCCM team on the unit.   -- Amie Portland, MD Neurologist [facility] Pager: [phone]   CRITICAL CARE ATTESTATION Performed by: Amie Portland, MD Total critical care time: 37 minutes Critical care time was exclusive of separately billable procedures and treating other patients and/or supervising APPs/Residents/Students Critical care was necessary to treat or prevent imminent or life-threatening deterioration due to hypoxic/anoxic brain injury This patient is critically ill and at significant risk for neurological worsening and/or death and care  requires constant monitoring. Critical care was time spent personally by me on the following activities: development of treatment plan with patient and/or surrogate as well as nursing, discussions with consultants, evaluation of patient's response to treatment, examination of patient, obtaining history from patient or surrogate, ordering and performing treatments and interventions, ordering and review of laboratory studies, ordering and review of radiographic studies, pulse oximetry, re-evaluation of patient's condition, participation in multidisciplinary rounds and medical decision making of high complexity in the care of this patient.

## 2021-06-16 NOTE — Progress Notes (Signed)
Eeg done 

## 2021-06-16 NOTE — Progress Notes (Signed)
GOALS OF CARE DISCUSSION  The Clinical status was relayed to family in detail. Mother, brother at bedside-Case discussed with ICU team and Neurology Updated and notified of patients medical condition.    Patient remains unresponsive and will not open eyes to command.   Patient is having a weak cough and struggling to remove secretions.   Patient with increased WOB and using accessory muscles to breathe Explained to family course of therapy and the modalities   Patient showing signs of severe brain damage Prognosis is very poor   Patient with Progressive multiorgan failure with a very high probablity of a very minimal chance of meaningful recovery despite all aggressive and optimal medical therapy.  PATIENT REMAINS FULL CODE  Family understands the situation.   Family are satisfied with Plan of action and management. All questions answered  Additional CC time 25 mins   Edward Jennings Santiago Glad, M.D.  [facility]  Medical Director [facility] Medical Director [facility] Cardio-Pulmonary Department

## 2021-06-16 NOTE — Plan of Care (Signed)

## 2021-06-16 NOTE — Progress Notes (Signed)
NAME:  Edward Jennings, MRN:  [id number], DOB:  1996-06-08, LOS: 2 ADMISSION DATE:  06/02/2021, CONSULTATION DATE:  05/27/2021 REFERRING MD:  Dr. Cheri Fowler, CHIEF COMPLAINT:  cardiac/respiratory arrest   History of Present Illness:  25 yo M presenting to [facility] ED via EMS from home in respiratory distress. Per EDP handoff the patient received Duo-neb and 125 mg of Solu-medrol with EMS, who placed the patient on CPAP for respiratory support. EMS reported on arrival to ED that the patient became severely agitated en route, throwing EMS personnel across the truck" ED course: Upon arrival to [facility] ED patient was unresponsive & in respiratory distress, then shortly after arrival lost pulses. Initial rhythm was asystole then PEA, he received CPR for 30 minutes including multiple rounds of epinephrine and initiation of a levophed drip. Patient was emergently intubated requiring mechanical ventilatory support. Initial vitals: 37.5, tachypneic 23, tachycardic 138, BP 90/48 (61) on levophed drip & SPO2 97% on 100% FiO2 Significant Labs: hyperkalemia- 6.8, AKI- BUN/Cr: 11/ 1.56, mild Transaminitis: AST/ALT- 122/82, elevated troponin: 118, BNP- 43.3, lactic acidosis- 7.5, leukocytosis- 35.1, severe respiratory acidosis - ABG: < 6.9/ >120/ 115/ unreadable. Labs/ Imaging personally reviewed CT head wo contrast 06/07/2021 > no acute intracranial abnormality. CT angio chest 06/08/2021 > negative for PE, patchy areas of infiltration/ atelectasis bilaterally with focal consolidation & volume loss in the lower lungs. CXR 06/17/2021> mild perihilar infiltrate  I, Domingo Pulse Rust-Chester, AGACNP-BC, personally viewed and interpreted this ECG. EKG Interpretation Date: 05/29/2021 EKG Time: 19:43 Rate: 155 Rhythm: Sinus Tachycardia QRS Axis: borderline LAD Intervals: prolonged Qtc- 535 ST/T Wave abnormalities: diffuse ST depression Narrative Interpretation: ST with signs of ischemia and prolonged Qtc  Patient lives with his older  brother, [name], who I reached by phone with his other brother- [name]'s assistance.  [name] reported that he has heard the patient using his nebulizer machine in the middle of night frequently over the last 2 weeks. [name] denied anyone else in the household as being ill. He was not sure about signs/symptoms of illness, but reported that the patient was not acting differently and did not have any complaints recently.  Earlier this evening, 06/13/2021, [name] stated his brother called him on the phone from inside the house asking him to call the ambulance because he couldn't breathe. [name] reported that when he found the patient he was on his knees leaning on the bed for support and was so short of breath that he "stopped talking". [name] stated that the patient does NOT smoke cigarettes/use tobacco products, drinks socially only & denies any other recreational drug use.  PCCM consulted for admission to ICU. Pertinent  Medical History  Asthma Obesity  Significant Hospital Events: Including procedures, antibiotic start and stop dates in addition to other pertinent events   06/24/2021: Arrived in respiratory distress, in ED lost pulses- asystole then PEA, CPR for 30 minutes prior to ROSC- emergently intubated and admitted to ICU with normothermia protocol 8/22 REMAINS CRITICALLY ILL ON VENT 8/22 EEG SHOWING BURST SUPPRESSION, SEVERE HYPOXIA  Interim History / Subjective:  REMAINS ON VENT REMAINS CRITICALLY ILL SEVERE HYPOXIA EEG +FOR BURST SUPPRESSION ON NORMOTHERMIA PROTOCOL Vent Mode: PRVC FiO2 (%):  [70 %] 70 % Set Rate:  [26 bmp] 26 bmp Vt Set:  [500 mL] 500 mL PEEP:  [10 cmH20] 10 cmH20 Plateau Pressure:  [26 cmH20-32 cmH20] 26 cmH20   Objective   Blood pressure 115/68, pulse (!) 115, temperature 98.6 F (37 C), resp. rate (!) 0, height 5'  9" (1.753 m), weight (!) 154.4 kg, SpO2 100 %.    Vent Mode: PRVC FiO2 (%):  [70 %] 70 % Set Rate:  [26 bmp] 26 bmp Vt Set:  [500 mL] 500 mL PEEP:   [10 cmH20] 10 cmH20 Plateau Pressure:  [26 cmH20-32 cmH20] 26 cmH20   Intake/Output Summary (Last 24 hours) at 06/16/2021 0723 Last data filed at 06/16/2021 0600 Gross per 24 hour  Intake 4143.77 ml  Output 2570 ml  Net 1573.77 ml    Filed Weights   06/20/2021 2002 06/15/21 0405  Weight: (!) 157.3 kg (!) 154.4 kg   REVIEW OF SYSTEMS  PATIENT IS UNABLE TO PROVIDE COMPLETE REVIEW OF SYSTEMS DUE TO SEVERE CRITICAL ILLNESS AND TOXIC METABOLIC ENCEPHALOPATHY  PHYSICAL EXAMINATION:  GENERAL:critically ill appearing, +resp distress EYES: Pupils equal, round, reactive to light.  No scleral icterus.  MOUTH: Moist mucosal membrane. INTUBATED NECK: Supple.  PULMONARY: +rhonchi, +wheezing CARDIOVASCULAR: S1 and S2.  No murmurs  GASTROINTESTINAL: Soft, nontender, -distended. Positive bowel sounds.  MUSCULOSKELETAL: No swelling, clubbing, or edema.  NEUROLOGIC: obtunded SKIN:intact,warm,dry    Assessment & Plan:  25 YO MORBIDLY OBESE AAM ADMITTED FOR SEVERE AND ACUTE RESPIRATORY FAILURE WITH ACUTE AND SEEVERE ASTHMA EXACERBATION LEADING TO SUDDEN CARDIAC ARREST AND DEATH, EEG SHOWING SIGNS OF BRAIN DAMAGE  Severe ACUTE Hypoxic and Hypercapnic Respiratory Failure -continue Mechanical Ventilator support -continue Bronchodilator Therapy -Wean Fio2 and PEEP as tolerated -VAP/VENT bundle implementation -will NOT perform SAT/SBT when respiratory parameters are met    SEVERE ASTHMA EXACERBATION -continue IV steroids as prescribed -continue NEB THERAPY as prescribed -morphine as needed -wean fio2 as needed and tolerated  ACUTE CARDIAC FAILURE- NSTEMI Cardiac arrest secondary to respiratory arrest in the setting of asthma exacerbation   Circulatory shock Elevated Troponin secondary to demand ischemia vs N-STEMI  QTc prolongation StartED Normothermia protocol, if patient temp > 37.6, goal 37 degrees  CARDIOGENIC shock SOURCE-ISCHEMIC CARDIOMYOPATHY -use vasopressors to keep MAP>65 as  needed - Echocardiogram ordered   NEUROLOGY ACUTE TOXIC METABOLIC ENCEPHALOPATHY -need for sedation -Goal RASS -2 to -3 ON NORMOTHERMIA PROTOCOL ABNORMAL EEG FINDINGS FOLLOW  UP EEG TODAY AND NEUROLOGY RECS - loaded with Keppra due to full body myoclonic jerking, continue Keppra IV Q 12 h     GI GI PROPHYLAXIS as indicated  NUTRITIONAL STATUS DIET-->TF's as tolerated Constipation protocol as indicated Mild Transaminitis in the setting of cardiac arrest - Trend hepatic function - avoid hepatotoxic agents  ENDO - ICU hypoglycemic\Hyperglycemia protocol -check FSBS per protocol   ELECTROLYTES -follow labs as needed -replace as needed -pharmacy consultation and following    Best Practice (right click and "Reselect all SmartList Selections" daily)  Diet/type: NPO w/ meds via tube DVT prophylaxis: prophylactic heparin  GI prophylaxis: PPI Lines: N/A- consent obtained for CVC placement Foley:  Yes, and it is still needed Code Status:  full code Last date of multidisciplinary goals of care discussion [06/24/2021] ICU  STATUS   Labs   CBC: Recent Labs  Lab 06/19/2021 2029 06/15/21 0437 06/16/21 0447  WBC 35.1* 20.1* 21.3*  NEUTROABS 17.6*  --  17.6*  HGB 15.5 14.4 12.6*  HCT 48.2 44.1 38.7*  MCV 93.8 92.8 91.9  PLT 403* 327 307     Basic Metabolic Panel: Recent Labs  Lab 05/31/2021 2029 06/08/2021 2231 06/15/21 0038 06/15/21 0133 06/15/21 0437 06/15/21 0638 06/15/21 1309 06/15/21 1513 06/16/21 0447  NA 135  --   --  136  --   --  139  --  141  K 6.8*  --    < > 4.3  --  4.9 5.8* 4.8 4.4  CL 99  --   --  105  --   --  106  --  107  CO2 24  --   --  23  --   --  21*  --  26  GLUCOSE 273*  --   --  181*  --   --  138*  --  178*  BUN 11  --   --  14  --   --  16  --  19  CREATININE 1.56*  --   --  1.83*  --   --  1.66*  --  1.33*  CALCIUM 9.5  --   --  9.1  --   --  8.5*  --  8.0*  MG  --  3.2*  --   --  2.7*  --   --   --  2.4  PHOS  --  5.8*  --   --   2.1*  --   --  3.6 3.6   < > = values in this interval not displayed.    GFR: Estimated Creatinine Clearance: 123 mL/min (A) (by C-G formula based on SCr of 1.33 mg/dL (H)). Recent Labs  Lab 06/05/2021 2029 06/16/2021 2231 06/15/21 0133 06/15/21 0437 06/15/21 1309 06/16/21 0447  PROCALCITON  --  4.96  --  37.45  --  21.09  WBC 35.1*  --   --  20.1*  --  21.3*  LATICACIDVEN 7.5*  --  4.4* 4.7* 4.4*  --      Liver Function Tests: Recent Labs  Lab 06/19/2021 2029 06/15/21 0133 06/16/21 0447  AST 122* 113* 87*  ALT 82* 80* 62*  ALKPHOS 114 56 41  BILITOT 0.5 0.5 0.6  PROT 8.2* 7.3 6.5  ALBUMIN 4.1 3.8 3.3*    No results for input(s): LIPASE, AMYLASE in the last 168 hours. No results for input(s): AMMONIA in the last 168 hours.  ABG    Component Value Date/Time   PHART 7.33 (L) 06/16/2021 0500   PCO2ART 44 06/16/2021 0500   PO2ART 84 06/16/2021 0500   HCO3 23.2 06/16/2021 0500   ACIDBASEDEF 2.8 (H) 06/16/2021 0500   O2SAT 95.5 06/16/2021 0500     Coagulation Profile: Recent Labs  Lab 06/15/21 0038  INR 1.2       DVT/GI PRX  assessed I Assessed the need for Labs I Assessed the need for Foley I Assessed the need for Central Venous Line Family Discussion when available I Assessed the need for Mobilization I made an Assessment of medications to be adjusted accordingly Safety Risk assessment completed  CASE DISCUSSED IN MULTIDISCIPLINARY ROUNDS WITH ICU TEAM     Critical Care Time devoted to patient care services described in this note is 58 minutes.  Critical care was necessary to treat /prevent imminent and life-threatening deterioration. Overall, patient is critically ill, prognosis is guarded.  Patient with Multiorgan failure and at high risk for cardiac arrest and death.    Corrin Parker, M.D.  [facility]  Medical Director [place] Director [facility] Cardio-Pulmonary Department

## 2021-06-16 NOTE — Progress Notes (Signed)
*  PRELIMINARY RESULTS* Echocardiogram 2D Echocardiogram has been performed.  Edward Jennings 06/16/2021, 8:58 AM

## 2021-06-16 NOTE — Progress Notes (Signed)
   06/16/21 1403  Clinical Encounter Type  Visited With Patient and family together  Visit Type Follow-up;Spiritual support;Social support  Referral From Chaplain  Consult/Referral To Chaplain  Stress Factors  Family Stress Factors Loss of control;Health changes   Chaplain responded to page form ICU secretary stating the family could use some support. The PT's family had just received sad news regarding PT's health. When the Chaplain arrived PT's Mom and brother were at bedside. Chaplain ministered with hospitality, prayer, and a calm presence. Chaplain will request for the evening Chaplain to follow up.   Posey Boyer, M.Div.

## 2021-06-16 NOTE — Progress Notes (Signed)
ANTICOAGULATION CONSULT NOTE  Pharmacy Consult for heparin gtt Indication: chest pain/ACS post CA.  No Known Allergies  Patient Measurements: Height: 5\' 9"  (175.3 cm) Weight: (!) 154.4 kg (340 lb 6.2 oz) IBW/kg (Calculated) : 70.7 Heparin Dosing Weight: 109.1kg  Vital Signs: Temp: 99.3 F (37.4 C) (08/23 0200) BP: 120/91 (08/23 0130) Pulse Rate: 121 (08/23 0200)  Labs: Recent Labs    06-26-2021 2029 06/26/2021 2231 06/15/21 0038 06/15/21 0133 06/15/21 0437 06/15/21 0638 06/15/21 1309 06/15/21 1513 06/15/21 1701 06/16/21 0118  HGB 15.5  --   --   --  14.4  --   --   --   --   --   HCT 48.2  --   --   --  44.1  --   --   --   --   --   PLT 403*  --   --   --  327  --   --   --   --   --   APTT  --   --  28  --   --   --   --   --   --   --   LABPROT  --   --  14.7  --   --   --   --   --   --   --   INR  --   --  1.2  --   --   --   --   --   --   --   HEPARINUNFRC  --   --   --   --   --  0.10*  --   --  0.19* 0.25*  CREATININE 1.56*  --   --  1.83*  --   --  1.66*  --   --   --   TROPONINIHS 118*   < >  --  657* 785*  --  317* 317*  --   --    < > = values in this interval not displayed.     Estimated Creatinine Clearance: 98.5 mL/min (A) (by C-G formula based on SCr of 1.66 mg/dL (H)).   Medications: NKDA PTA: no AC/APT PTA Inpatient: +heparin gtt Heparin Dosing Weight: 109.1kg  Assessment: 25yo male brought to ED in acute respiratory distress, lost pulse, developed asystole then PEA arrest achieving ROSC after 30-minutes of CPR, emergently intubated, and transferred to the ICU. Post-arrest Troponin elevation 118>476 w/ ST changes noted on EKG (Qtc bazett 06/05/2021; VR 155bpm per Fridericia ). Pharmacy consulted for the mgmt of heparin gtt.  Baseline Labs: Hgb - 15.5 Plts - 403  Goal of Therapy:  Heparin level 0.3-0.7 units/ml Monitor platelets by anticoagulation protocol: Yes  08/23 0118 HL 0.25   Plan:  HL subtherapeutic Give 1600 unit  bolus Increase heparin infusion to 2150 units/hr Recheck HL in 6 hr after rate change. Continue to monitor H&H and platelets  9/23, PharmD, [facility] 06/16/2021 2:21 AM

## 2021-06-17 ENCOUNTER — Inpatient Hospital Stay: Payer: Self-pay

## 2021-06-17 ENCOUNTER — Inpatient Hospital Stay
Admission: AD | Admit: 2021-06-17 | Payer: Self-pay | Source: Other Acute Inpatient Hospital | Admitting: Internal Medicine

## 2021-06-17 LAB — BASIC METABOLIC PANEL
Anion gap: 6 (ref 5–15)
BUN: 28 mg/dL — ABNORMAL HIGH (ref 6–20)
CO2: 27 mmol/L (ref 22–32)
Calcium: 8.7 mg/dL — ABNORMAL LOW (ref 8.9–10.3)
Chloride: 109 mmol/L (ref 98–111)
Creatinine, Ser: 1.13 mg/dL (ref 0.61–1.24)
GFR, Estimated: >60 mL/min (ref >60)
Glucose, Bld: 168 mg/dL — ABNORMAL HIGH (ref 70–99)
Potassium: 4.2 mmol/L (ref 3.5–5.1)
Sodium: 142 mmol/L (ref 135–145)

## 2021-06-17 LAB — PHOSPHORUS: Phosphorus: 2.8 mg/dL (ref 2.5–4.6)

## 2021-06-17 LAB — CBC WITH DIFFERENTIAL/PLATELET
Abs Immature Granulocytes: 0.1 10*3/uL — ABNORMAL HIGH (ref 0.00–0.07)
Basophils Absolute: 0 10*3/uL (ref 0.0–0.1)
Basophils Relative: 0 %
Eosinophils Absolute: 0 10*3/uL (ref 0.0–0.5)
Eosinophils Relative: 0 %
HCT: 38 % — ABNORMAL LOW (ref 39.0–52.0)
Hemoglobin: 12.3 g/dL — ABNORMAL LOW (ref 13.0–17.0)
Immature Granulocytes: 1 %
Lymphocytes Relative: 10 %
Lymphs Abs: 1.9 10*3/uL (ref 0.7–4.0)
MCH: 30.2 pg (ref 26.0–34.0)
MCHC: 32.4 g/dL (ref 30.0–36.0)
MCV: 93.4 fL (ref 80.0–100.0)
Monocytes Absolute: 1.5 10*3/uL — ABNORMAL HIGH (ref 0.1–1.0)
Monocytes Relative: 8 %
Neutro Abs: 15.7 10*3/uL — ABNORMAL HIGH (ref 1.7–7.7)
Neutrophils Relative %: 81 %
Platelets: 290 10*3/uL (ref 150–400)
RBC: 4.07 MIL/uL — ABNORMAL LOW (ref 4.22–5.81)
RDW: 14.6 % (ref 11.5–15.5)
WBC: 19.2 10*3/uL — ABNORMAL HIGH (ref 4.0–10.5)
nRBC: 0 % (ref 0.0–0.2)

## 2021-06-17 LAB — GLUCOSE, CAPILLARY
Glucose-Capillary: 144 mg/dL — ABNORMAL HIGH (ref 70–99)
Glucose-Capillary: 116 mg/dL — ABNORMAL HIGH (ref 70–99)
Glucose-Capillary: 109 mg/dL — ABNORMAL HIGH (ref 70–99)

## 2021-06-17 LAB — MAGNESIUM: Magnesium: 2.6 mg/dL — ABNORMAL HIGH (ref 1.7–2.4)

## 2021-06-17 LAB — CULTURE, RESPIRATORY W GRAM STAIN: Culture: NORMAL

## 2021-06-17 MED ORDER — MORPHINE BOLUS VIA INFUSION
2.0000 mg | INTRAVENOUS | Status: DC | PRN
Start: 2021-06-17 — End: 2021-06-18
  Administered 2021-06-17: 2 mg via INTRAVENOUS
  Filled 2021-06-17: qty 2

## 2021-06-17 MED ORDER — FAMOTIDINE IN NACL 20-0.9 MG/50ML-% IV SOLN: 20.0000 mg | Freq: Two times a day (BID) | INTRAVENOUS | Status: AC

## 2021-06-17 MED ORDER — LEVETIRACETAM IN NACL 1000 MG/100ML IV SOLN
1000.0000 mg | INTRAVENOUS | Status: AC
  Administered 2021-06-17: 1000 mg via INTRAVENOUS
  Filled 2021-06-17: qty 100

## 2021-06-17 MED ORDER — MORPHINE 100MG IN NS 100ML (1MG/ML) PREMIX INFUSION
1.0000 mg/h | INTRAVENOUS | Status: DC
  Administered 2021-06-17: 5 mg/h via INTRAVENOUS
  Administered 2021-06-17: 10 mg/h via INTRAVENOUS
  Filled 2021-06-17 (×2): qty 100

## 2021-06-17 MED ORDER — DOCUSATE SODIUM 50 MG/5ML PO LIQD: 100.0000 mg | Freq: Every day | ORAL | 0 refills | Status: AC | PRN

## 2021-06-17 MED ORDER — VITAL HIGH PROTEIN PO LIQD: 1000.0000 mL | ORAL | Status: AC

## 2021-06-17 MED ORDER — ORAL CARE MOUTH RINSE: 15.0000 mL | OROMUCOSAL | 0 refills | Status: AC

## 2021-06-17 MED ORDER — MIDAZOLAM HCL 2 MG/2ML IJ SOLN: 2.0000 mg | INTRAMUSCULAR | 0 refills | Status: AC | PRN

## 2021-06-17 MED ORDER — ENOXAPARIN SODIUM 80 MG/0.8ML IJ SOSY: 0.5000 mg/kg | PREFILLED_SYRINGE | INTRAMUSCULAR | Status: DC

## 2021-06-17 MED ORDER — LEVETIRACETAM IN NACL 1500 MG/100ML IV SOLN
1500.0000 mg | Freq: Two times a day (BID) | INTRAVENOUS | Status: DC
  Administered 2021-06-17: 1500 mg via INTRAVENOUS
  Filled 2021-06-17 (×3): qty 100

## 2021-06-17 MED ORDER — SODIUM CHLORIDE 0.9 % IV SOLN: 250.0000 mL | INTRAVENOUS | 0 refills | Status: AC

## 2021-06-17 MED ORDER — BUDESONIDE 0.25 MG/2ML IN SUSP
0.2500 mg | Freq: Two times a day (BID) | RESPIRATORY_TRACT | Status: DC
  Administered 2021-06-17: 0.25 mg via RESPIRATORY_TRACT
  Filled 2021-06-17: qty 2

## 2021-06-17 MED ORDER — FENTANYL 2500MCG IN NS 250ML (10MCG/ML) PREMIX INFUSION: 25.0000 ug/h | INTRAVENOUS | 0 refills | Status: AC

## 2021-06-17 MED ORDER — DEXAMETHASONE SODIUM PHOSPHATE 10 MG/ML IJ SOLN: 6.0000 mg | Freq: Three times a day (TID) | INTRAMUSCULAR | 0 refills | Status: AC

## 2021-06-17 MED ORDER — ATROPINE SULFATE 1 MG/10ML IJ SOSY
PREFILLED_SYRINGE | INTRAMUSCULAR | Status: AC
  Filled 2021-06-17: qty 10

## 2021-06-17 MED ORDER — POLYETHYLENE GLYCOL 3350 17 G PO PACK: 17.0000 g | PACK | Freq: Every day | ORAL | 0 refills | Status: AC | PRN

## 2021-06-17 MED ORDER — INSULIN ASPART 100 UNIT/ML IJ SOLN: 0.0000 [IU] | INTRAMUSCULAR | 11 refills | Status: AC

## 2021-06-17 MED ORDER — DOCUSATE SODIUM 50 MG/5ML PO LIQD: 100.0000 mg | Freq: Two times a day (BID) | ORAL | 0 refills | Status: AC

## 2021-06-17 MED ORDER — GLYCOPYRROLATE 0.2 MG/ML IJ SOLN
0.4000 mg | INTRAMUSCULAR | Status: DC | PRN
  Administered 2021-06-17 – 2021-06-18 (×2): 0.4 mg via INTRAVENOUS
  Filled 2021-06-17 (×2): qty 2

## 2021-06-17 MED ORDER — SODIUM CHLORIDE 0.9 % IV SOLN: 3.0000 g | Freq: Four times a day (QID) | INTRAVENOUS | Status: AC

## 2021-06-17 MED ORDER — PROSOURCE TF PO LIQD: 90.0000 mL | Freq: Two times a day (BID) | ORAL | Status: AC

## 2021-06-17 MED ORDER — IPRATROPIUM-ALBUTEROL 0.5-2.5 (3) MG/3ML IN SOLN: 3.0000 mL | RESPIRATORY_TRACT | Status: AC

## 2021-06-17 MED ORDER — BUDESONIDE 0.25 MG/2ML IN SUSP: 0.2500 mg | Freq: Two times a day (BID) | RESPIRATORY_TRACT | 12 refills | Status: AC

## 2021-06-17 MED ORDER — LEVETIRACETAM IN NACL 1500 MG/100ML IV SOLN: 1500.0000 mg | Freq: Two times a day (BID) | INTRAVENOUS | Status: AC

## 2021-06-17 MED ORDER — CHLORHEXIDINE GLUCONATE CLOTH 2 % EX PADS: 6.0000 | MEDICATED_PAD | Freq: Every day | CUTANEOUS | Status: AC

## 2021-06-17 MED ORDER — ENOXAPARIN SODIUM 80 MG/0.8ML IJ SOSY: 0.5000 mg/kg | PREFILLED_SYRINGE | INTRAMUSCULAR | Status: AC

## 2021-06-17 MED ORDER — POLYETHYLENE GLYCOL 3350 17 G PO PACK: 17.0000 g | PACK | Freq: Every day | ORAL | 0 refills | Status: AC

## 2021-06-17 MED ORDER — SODIUM CHLORIDE 0.9 % IV SOLN
3.0000 g | Freq: Four times a day (QID) | INTRAVENOUS | Status: DC
  Filled 2021-06-17 (×5): qty 8

## 2021-06-17 MED ORDER — FENTANYL BOLUS VIA INFUSION: 50.0000 ug | INTRAVENOUS | 0 refills | Status: AC | PRN

## 2021-06-17 MED ORDER — LORAZEPAM 2 MG/ML IJ SOLN
2.0000 mg | INTRAMUSCULAR | Status: DC | PRN
  Administered 2021-06-17: 4 mg via INTRAVENOUS
  Filled 2021-06-17: qty 2

## 2021-06-17 MED ORDER — ONDANSETRON HCL 4 MG/2ML IJ SOLN: 4.0000 mg | Freq: Four times a day (QID) | INTRAMUSCULAR | 0 refills | Status: AC | PRN

## 2021-06-17 MED ORDER — FREE WATER: 75.0000 mL | Status: AC

## 2021-06-17 MED ORDER — CHLORHEXIDINE GLUCONATE 0.12% ORAL RINSE (MEDLINE KIT): 15.0000 mL | Freq: Two times a day (BID) | OROMUCOSAL | 0 refills | Status: AC

## 2021-06-17 MED ORDER — METOCLOPRAMIDE HCL 5 MG/ML IJ SOLN: 10.0000 mg | Freq: Four times a day (QID) | INTRAMUSCULAR | 0 refills | Status: AC

## 2021-06-17 NOTE — Progress Notes (Signed)
Attempted to visit with family after being called by staff. Mother was not receptive of visit, she is understandably experiencing deep grief. Communicated with medical staff regarding next steps, chaplain is available to provide support if and when needed.

## 2021-06-17 NOTE — Progress Notes (Signed)
Neurology Progress Note   S:// Seen and examined. Off of sedation and started having myoclonic jerking requiring Versed overnight. Has frequent myoclonic jerks involving the whole body   O:// Current vital signs: BP (!) 154/69   Pulse (!) 115   Temp 99.3 F (37.4 C) (Esophageal)   Resp (!) 24   Ht '5\' 9"'  (1.753 m)   Wt (!) 158.2 kg Comment: [place] pads in place and filled  SpO2 100%   BMI 51.50 kg/m  Vital signs in last 24 hours: Temp:  [97 F (36.1 C)-100 F (37.8 C)] 99.3 F (37.4 C) (08/24 0800) Pulse Rate:  [100-116] 115 (08/24 0700) Resp:  [0-27] 24 (08/24 0700) BP: (116-154)/(56-75) 154/69 (08/24 0700) SpO2:  [96 %-100 %] 100 % (08/24 0700) FiO2 (%):  [35 %-40 %] 35 % (08/24 0806) Weight:  [158.2 kg] 158.2 kg (08/24 0453) General: Sedated with fentanyl, intubated HEENT: Normocephalic/atraumatic Cardiovascular: Regular rhythm Respiratory: Vented Abdomen: Obese, nontender Neurological exam Sedated with fentanyl Intubated No spontaneous movements-similar to yesterday Breathing above the ventilator-different from yesterday Pupils 2 mm nonreactive-similar to yesterday Very sluggish corneal reflexes present bilaterally-similar to yesterday No response to noxious stimulation-similar to yesterday  Medications  Current Facility-Administered Medications:    0.9 %  sodium chloride infusion, 250 mL, Intravenous, Continuous, Rust-Chester, Toribio Harbour L, NP, Last Rate: 10 mL/hr at 06/15/21 1702, Restarted at 06/15/21 1702   0.9 %  sodium chloride infusion, 250 mL, Intravenous, Continuous, Darnelle Bos, RPH, Last Rate: 10 mL/hr at 06/17/21 0900, Infusion Verify at 06/17/21 0900   chlorhexidine gluconate (MEDLINE KIT) (PERIDEX) 0.12 % solution 15 mL, 15 mL, Mouth Rinse, BID, Mortimer Fries, Kurian, MD, 15 mL at 06/16/21 1947   Chlorhexidine Gluconate Cloth 2 % PADS 6 each, 6 each, Topical, QHS, Kasa, Maretta Bees, MD, 6 each at 06/16/21 2125   dexamethasone (DECADRON) injection 6 mg, 6  mg, Intravenous, TID, Flora Lipps, MD, 6 mg at 06/16/21 2131   docusate (COLACE) 50 MG/5ML liquid 100 mg, 100 mg, Per Tube, Daily PRN, Jonnie Finner, Michele Mcalpine, MD   docusate (COLACE) 50 MG/5ML liquid 100 mg, 100 mg, Per Tube, BID, Rust-Chester, Toribio Harbour L, NP, 100 mg at 06/16/21 2131   famotidine (PEPCID) IVPB 20 mg premix, 20 mg, Intravenous, Q12H, Kasa, Maretta Bees, MD, Stopped at 06/16/21 2204   feeding supplement (PROSource TF) liquid 90 mL, 90 mL, Per Tube, BID, Kasa, Kurian, MD, 90 mL at 06/16/21 2126   feeding supplement (VITAL HIGH PROTEIN) liquid 1,000 mL, 1,000 mL, Per Tube, Q24H, Kasa, Kurian, MD   fentaNYL (SUBLIMAZE) bolus via infusion 50 mcg, 50 mcg, Intravenous, Q1H PRN, Naaman Plummer, MD   fentaNYL 2541mg in NS 2532m(1018mml) infusion-PREMIX, 25-400 mcg/hr, Intravenous, Continuous, Bradler, EvaVista LawmanD, Last Rate: 7.5 mL/hr at 06/17/21 0900, 75 mcg/hr at 06/17/21 0900   free water 75 mL, 75 mL, Per Tube, Q4H, Kasa, Kurian, MD   insulin aspart (novoLOG) injection 0-15 Units, 0-15 Units, Subcutaneous, Q4H, Rust-Chester, Britton L, NP, 2 Units at 06/17/21 0342   ipratropium-albuterol (DUONEB) 0.5-2.5 (3) MG/3ML nebulizer solution 3 mL, 3 mL, Nebulization, Q4H, Rust-Chester, Britton L, NP, 3 mL at 06/17/21 0806   levETIRAcetam (KEPPRA) IVPB 1000 mg/100 mL premix, 1,000 mg, Intravenous, Q12H, Rust-Chester, BriHuel CoteP, Stopped at 06/17/21 0019   MEDLINE mouth rinse, 15 mL, Mouth Rinse, 10 times per day, KasFlora LippsD, 15 mL at 06/17/21 0539   metoCLOPramide (REGLAN) injection 10 mg, 10 mg, Intravenous, Q6H, Kasa, Kurian, MD, 10 mg at 06/17/21  0539   midazolam (VERSED) injection 2-4 mg, 2-4 mg, Intravenous, Q1H PRN, Rust-Chester, Britton L, NP, 2 mg at 06/17/21 0342   ondansetron (ZOFRAN) injection 4 mg, 4 mg, Intravenous, Q6H PRN, Rust-Chester, Britton L, NP, 4 mg at 06/06/2021 2116   piperacillin-tazobactam (ZOSYN) IVPB 3.375 g, 3.375 g, Intravenous, Q8H, Kasa, Kurian, MD, Last Rate: 12.5  mL/hr at 06/17/21 0900, Infusion Verify at 06/17/21 0900   polyethylene glycol (MIRALAX / GLYCOLAX) packet 17 g, 17 g, Per Tube, Daily PRN, Rust-Chester, Britton L, NP   polyethylene glycol (MIRALAX / GLYCOLAX) packet 17 g, 17 g, Per Tube, Daily, Rust-Chester, Britton L, NP, 17 g at 06/16/21 0948 Labs CBC    Component Value Date/Time   WBC 19.2 (H) 06/17/2021 0326   RBC 4.07 (L) 06/17/2021 0326   HGB 12.3 (L) 06/17/2021 0326   HCT 38.0 (L) 06/17/2021 0326   PLT 290 06/17/2021 0326   MCV 93.4 06/17/2021 0326   MCH 30.2 06/17/2021 0326   MCHC 32.4 06/17/2021 0326   RDW 14.6 06/17/2021 0326   LYMPHSABS 1.9 06/17/2021 0326   MONOABS 1.5 (H) 06/17/2021 0326   EOSABS 0.0 06/17/2021 0326   BASOSABS 0.0 06/17/2021 0326    CMP     Component Value Date/Time   NA 142 06/17/2021 0326   K 4.2 06/17/2021 0326   CL 109 06/17/2021 0326   CO2 27 06/17/2021 0326   GLUCOSE 168 (H) 06/17/2021 0326   BUN 28 (H) 06/17/2021 0326   CREATININE 1.13 06/17/2021 0326   CALCIUM 8.7 (L) 06/17/2021 0326   PROT 6.5 06/16/2021 0447   ALBUMIN 3.3 (L) 06/16/2021 0447   AST 87 (H) 06/16/2021 0447   ALT 62 (H) 06/16/2021 0447   ALKPHOS 41 06/16/2021 0447   BILITOT 0.6 06/16/2021 0447   GFRNONAA >60 06/17/2021 0326   GFRAA >60 11/24/2017 0920    Imaging I have reviewed images in epic and the results pertinent to this consultation are: CT of the brain on arrival-no acute changes per initial reading. Second look on the images with questionable effacement of the sulci diffusely.  Repeat EEG yesterday with continuous generalized background suppression with nonreactive EEG to tactile stimulation suggestive of profound diffuse encephalopathy-in the cardiac arrest pattern is suggestive of anoxic/hypoxic brain injury.  No seizures or epileptiform activity seen  EEG ordered for today-pending   Assessment:  25 year old man, history of asthma presented with respiratory distress eventually lost pulses with PEA  cardiac arrest underwent 30 minutes of CPR following which he had emergent intubation with reportedly intact brainstem reflexes at that time and started exhibiting myoclonic jerking soon after.  Required heavy sedation due to vent dyssynchrony initially with propofol, which led to clinical resolution of myoclonic jerking.  Has been off of propofol for~24h.  Today's examination with minimal brainstem reflexes but is breathing over the vent-which he was not yesterday.  Has had frequent myoclonic jerking starting overnight.  Clinical history, examination, myoclonus very early after cardiac arrest and EEG findings suggestive of possible hypoxic/anoxic brain injury.   I would prefer that he be on continuous EEG monitoring to better treat his myoclonic jerks and will see if transfer to [facility] is possible.  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 Increase Keppra to 1500 twice daily. Also ordered additional 1g IV now. Will consider Depakote load followed by standing doses if he continues to have myoclonic jerking on increased dose of Keppra. Will also consider adding Klonopin.  Spot EEG-would prefer continuous EEG but those facilities are not available at this location. Will discuss potenital transfer with primary team - for cEEG to [facility]. They are reaching out to check for availability now. Will consider MRI at some point to assess for extent of anoxic injury but that is not emergent. Family not at bedside at the time of this note  Discussed my plan in detail with Dr. Patsey Berthold and Tana Conch, APP.  -- Amie Portland, MD Neurologist [facility] Pager: [phone]   CRITICAL CARE ATTESTATION Performed by: Amie Portland, MD Total critical care time: 32 minutes Critical care time was exclusive of separately billable procedures and treating other patients and/or supervising  APPs/Residents/Students Critical care was necessary to treat or prevent imminent or life-threatening deterioration due to hypoxic/anoxic brain injury This patient is critically ill and at significant risk for neurological worsening and/or death and care requires constant monitoring. Critical care was time spent personally by me on the following activities: development of treatment plan with patient and/or surrogate as well as nursing, discussions with consultants, evaluation of patient's response to treatment, examination of patient, obtaining history from patient or surrogate, ordering and performing treatments and interventions, ordering and review of laboratory studies, ordering and review of radiographic studies, pulse oximetry, re-evaluation of patient's condition, participation in multidisciplinary rounds and medical decision making of high complexity in the care of this patient.

## 2021-06-17 NOTE — Progress Notes (Signed)
Patient having increased myoclonic jerking of upper body over last hour. Webb Silversmith, NP made aware. PRN Versed given as ordered with no change. Will continue to monitor.

## 2021-06-17 NOTE — Discharge Summary (Signed)
Physician Discharge Summary  Patient ID: Edward Jennings MRN: [id number] DOB/AGE: 03-22-1996 25 y.o.  Admit date: 06/13/2021 Discharge date: 06/17/2021   Brief Pt Description / Synopsis:  25 year old morbidly obese male admitted with PEA cardiac arrest (30 minutes of CPR/ACLS before ROSC) due to respiratory arrest in the setting of severe asthma exacerbation.  Now with concerns for anoxic brain injury and myoclonus.  To transfer to [facility] for continuous EEG.  Discharge Diagnoses:   PEA cardiac arrest due to respiratory arrest in the setting of asthma exacerbation Circulatory shock>> resolved Elevated troponin Acute hypoxic and hypercapnic respiratory failure Acute asthma exacerbation Acute metabolic encephalopathy Concern for anoxic brain injury Myoclonus Suspected aspiration pneumonia Acute kidney injury                                                          Discharge Summary:  25 yo M presenting to [facility] ED via EMS from home in respiratory distress. Per EDP handoff the patient received Duo-neb and 125 mg of Solu-medrol with EMS, who placed the patient on CPAP for respiratory support. EMS reported on arrival to ED that the patient became severely agitated en route, throwing EMS personnel across the truck" ED course: Upon arrival to [facility] ED patient was unresponsive & in respiratory distress, then shortly after arrival lost pulses. Initial rhythm was asystole then PEA, he received CPR for 30 minutes including multiple rounds of epinephrine and initiation of a levophed drip. Patient was emergently intubated requiring mechanical ventilatory support. Initial vitals: 37.5, tachypneic 23, tachycardic 138, BP 90/48 (61) on levophed drip & SPO2 97% on 100% FiO2 Significant Labs: hyperkalemia- 6.8, AKI- BUN/Cr: 11/ 1.56, mild Transaminitis: AST/ALT- 122/82, elevated troponin: 118, BNP- 43.3, lactic acidosis- 7.5, leukocytosis- 35.1, severe respiratory acidosis - ABG: < 6.9/ >120/ 115/  unreadable. Labs/ Imaging personally reviewed CT head wo contrast 06/21/2021 > no acute intracranial abnormality. CT angio chest 06/16/2021 > negative for PE, patchy areas of infiltration/ atelectasis bilaterally with focal consolidation & volume loss in the lower lungs. CXR 06/13/2021> mild perihilar infiltrate   EKG Interpretation Date: 06/06/2021 EKG Time: 19:43 Rate: 155 Rhythm: Sinus Tachycardia QRS Axis: borderline LAD Intervals: prolonged Qtc- 535 ST/T Wave abnormalities: diffuse ST depression Narrative Interpretation: ST with signs of ischemia and prolonged Qtc   Patient lives with his older brother, Edward Jennings, who I reached by phone with his other brother- Edward Jennings's assistance.  Edward Jennings reported that he has heard the patient using his nebulizer machine in the middle of night frequently over the last 2 weeks. Edward Jennings denied anyone else in the household as being ill. He was not sure about signs/symptoms of illness, but reported that the patient was not acting differently and did not have any complaints recently.  Earlier this evening, 05/28/2021, Edward Jennings stated his brother called him on the phone from inside the house asking him to call the ambulance because he couldn't breathe. Edward Jennings reported that when he found the patient he was on his knees leaning on the bed for support and was so short of breath that he "stopped talking". Edward Jennings stated that the patient does NOT smoke cigarettes/use tobacco products, drinks socially only & denies any other recreational drug use.   PCCM consulted for admission to ICU.   Discharge Plan by Diagnosis:   PEA Cardiac Arrest due to  respiratory arrest in setting of asthma exacerbation Circulatory Shock>>resolved Elevated Troponin in setting of NSTEMI vs. Demand ischemia QTc Prolongation -Completed NORMOTHERMIA protocol -Continuous cardiac monitoring, serial EKG's -Maintain MAP >65 -IV fluids -Vasopressors as needed to maintain MAP goal~ weaned off pressors -HS Troponin  peaked at 785 -Completed IV Heparin for ACS -Echocardiogram on 06/16/21 with LVEF 38-93%, normal diastolic parameters, RV systolic function normal -Consider Cardiology consult   Acute Hypoxic & Hypercapnic Respiratory Failure in the setting of Asthma Exacerbation -Full vent support, implement lung protective strategies -Wean FiO2 & PEEP as tolerated to maintain O2 sats >92% -Plateau pressures less than 30 cm H20 -Follow intermittent Chest X-ray & ABG as needed -Spontaneous Breathing Trials when respiratory parameters met and mental status permits -Implement VAP Bundle -Bronchodilators and Budesonide nebs -IV steroids   Acute Metabolic Encephalopathy Concern for severe Anoxic Brain Injury Myoclonic Jerking -Maintain a RASS goal of 0 to -1 -Fentanyl and Propofol as needed to maintain RASS goal -Avoid sedating medications as able -Daily wake up assessment -Neurology following, appreciate input -CT Head at admission negative for acute changes -Urine drug screen + for Benzodiazepines -EEG on 06/16/21 with continuous generalized background suppression with nonreactive EEG to tactile stimulation suggestive of profound diffuse encephalopathy-in the cardiac arrest pattern is suggestive of anoxic/hypoxic brain injury.  No seizures or epileptiform activity seen -IV Keppra increased 8/24 to 1500 mg BID (additional 1 g given 8/24) -Plan for MRI Brain today 8/24 -Neurology recommends transfer to [facility] for continuous EEG   Suspected Aspiration Pneumonia -Monitor fever curve -Trend WBC's & Procalcitonin -Follow cultures as above -Continue empiric Unasyn pending cultures & sensitivities   Acute Kidney Injury>>improving -Monitor I&O's / urinary output -Follow BMP -Ensure adequate renal perfusion -Avoid nephrotoxic agents as able -Replace electrolytes as indicated   Significant Events:  05/27/2021: Arrived in respiratory distress, in ED lost pulses- asystole then PEA, CPR for 30 minutes  prior to [place]- emergently intubated and admitted to ICU with normothermia protocol 8/22 REMAINS CRITICALLY ILL ON VENT 8/22 EEG SHOWING BURST SUPPRESSION, SEVERE HYPOXIA 8/24: Minimal brainstem reflexes, development of Myoclonus with propofol held.  Plan for MRI Brain and transfer to [facility] for continuous EEG                 Significant Diagnostic Studies:  CT head wo contrast 06/19/2021 > no acute intracranial abnormality. CT angio chest 06/07/2021 > negative for PE, patchy areas of infiltration/ atelectasis bilaterally with focal consolidation & volume loss in the lower lungs. CXR 06/13/2021> mild perihilar infiltrate            Micro Data:  06/01/2021: SARS-CoV-2 and influenza PCR>> negative 06/08/2021: Strep pneumo urinary antigen>> negative 06/15/2021: Legionella urinary antigen>> negative 05/27/2021: Tracheal aspirate>> normal respiratory flora 06/15/2021: Respiratory viral panel>> negative 06/15/2021: Blood culture x2>> no growth to date  Antimicrobials:  Azithromycin 8/21 x 1 dose Ceftriaxone on 8/21>> 8/23 Doxycycline 8/21>> 8/23 Vancomycin 8/21>> 8/22 Zosyn 8/23>> 8/24 Unasyn 8/24>>  Consults:  PCCM Neurology  Discharge Exam:  Examination: General: Critically ill-appearing male, laying in bed, unresponsive, no acute distress HENT: Atraumatic, normocephalic, neck supple, no JVD Lungs: Mechanical breath sounds bilaterally, overbreathing the ventilator, even, nonlabored Cardiovascular: Tachycardia, regular rhythm, S1-S2, no murmurs, rubs, gallops, 2+ distal pulses Abdomen: Obese, soft, nontender, nondistended, no guarding rebound tenderness, bowel sounds positive x4 Extremities: No deformities, trace bilateral lower extremity edema Neuro: Unresponsive, minimal brainstem reflexes (no cough/gag reflex, no corneal reflex, pupils minimally responsive) GU: Foley catheter in place  Vitals:  06/17/21 0630 06/17/21 0658 06/17/21 0700 06/17/21 0800  BP: (!) 149/64  (!) 154/69    Pulse: (!) 113  (!) 115   Resp: (!) 21  (!) 24   Temp:  99.1 F (37.3 C)  99.3 F (37.4 C)  TempSrc:    Esophageal  SpO2: 100%  100%   Weight:      Height:         Discharge Labs:   BMET Recent Labs  Lab 06/20/2021 2029 06/07/2021 2231 06/15/21 0038 06/15/21 0133 06/15/21 0437 06/15/21 0638 06/15/21 1309 06/15/21 1513 06/16/21 0447 06/17/21 0326  NA 135  --   --  136  --   --  139  --  141 142  K 6.8*  --    < > 4.3  --    < > 5.8* 4.8 4.4 4.2  CL 99  --   --  105  --   --  106  --  107 109  CO2 24  --   --  23  --   --  21*  --  26 27  GLUCOSE 273*  --   --  181*  --   --  138*  --  178* 168*  BUN 11  --   --  14  --   --  16  --  19 28*  CREATININE 1.56*  --   --  1.83*  --   --  1.66*  --  1.33* 1.13  CALCIUM 9.5  --   --  9.1  --   --  8.5*  --  8.0* 8.7*  MG  --  3.2*  --   --  2.7*  --   --   --  2.4 2.6*  PHOS  --  5.8*  --   --  2.1*  --   --  3.6 3.6 2.8   < > = values in this interval not displayed.    CBC Recent Labs  Lab 06/15/21 0437 06/16/21 0447 06/17/21 0326  HGB 14.4 12.6* 12.3*  HCT 44.1 38.7* 38.0*  WBC 20.1* 21.3* 19.2*  PLT 327 307 290    Anti-Coagulation Recent Labs  Lab 06/15/21 0038  INR 1.2          Allergies as of 06/17/2021   No Known Allergies      Medication List     STOP taking these medications    albuterol (2.5 MG/3ML) 0.083% nebulizer solution Commonly known as: PROVENTIL   fluticasone-salmeterol 250-50 MCG/ACT Aepb Commonly known as: Advair Diskus   Fluticasone-Salmeterol 250-50 MCG/DOSE Aepb Commonly known as: Advair Diskus   montelukast 10 MG tablet Commonly known as: SINGULAIR   predniSONE 50 MG tablet Commonly known as: DELTASONE   Animator       TAKE these medications    Ampicillin-Sulbactam 3 g in sodium chloride 0.9 % 100 mL Inject 3 g into the vein every 6 (six) hours.   budesonide 0.25 MG/2ML nebulizer solution Commonly known as: PULMICORT Take 2 mLs  (0.25 mg total) by nebulization 2 (two) times daily.   chlorhexidine gluconate (MEDLINE KIT) 0.12 % solution Commonly known as: PERIDEX 15 mLs by Mouth Rinse route 2 (two) times daily.   Chlorhexidine Gluconate Cloth 2 % Pads Apply 6 each topically at bedtime.   dexamethasone 10 MG/ML injection Commonly known as: DECADRON Inject 0.6 mLs (6 mg total) into the vein 3 (three) times daily.   docusate 50 MG/5ML liquid Commonly known  as: COLACE Place 10 mLs (100 mg total) into feeding tube daily as needed for mild constipation.   docusate 50 MG/5ML liquid Commonly known as: COLACE Place 10 mLs (100 mg total) into feeding tube 2 (two) times daily.   enoxaparin 80 MG/0.8ML injection Commonly known as: LOVENOX Inject 0.8 mLs (80 mg total) into the skin daily.   famotidine 20-0.9 MG/50ML-% Commonly known as: PEPCID Inject 50 mLs (20 mg total) into the vein every 12 (twelve) hours.   feeding supplement (VITAL HIGH PROTEIN) Liqd liquid Place 1,000 mLs into feeding tube daily.   feeding supplement (PROSource TF) liquid Place 90 mLs into feeding tube 2 (two) times daily.   fentaNYL 10 mcg/ml Soln infusion Inject 25-400 mcg/hr into the vein continuous.   fentaNYL Soln Commonly known as: SUBLIMAZE Inject 50 mcg into the vein every hour as needed.   free water Soln Place 75 mLs into feeding tube every 4 (four) hours.   insulin aspart 100 UNIT/ML injection Commonly known as: novoLOG Inject 0-15 Units into the skin every 4 (four) hours.   ipratropium-albuterol 0.5-2.5 (3) MG/3ML Soln Commonly known as: DUONEB Take 3 mLs by nebulization every 4 (four) hours.   levETIRacetam 1500 MG/100ML Soln Commonly known as: KEPPRA Inject 100 mLs (1,500 mg total) into the vein every 12 (twelve) hours.   metoCLOPramide 5 MG/ML injection Commonly known as: REGLAN Inject 2 mLs (10 mg total) into the vein every 6 (six) hours.   midazolam 2 MG/2ML Soln injection Commonly known as:  VERSED Inject 2-4 mLs (2-4 mg total) into the vein every hour as needed for agitation or sedation (seizure).   mouth rinse Liqd solution 15 mLs by Mouth Rinse route every 2 (two) hours.   ondansetron 4 MG/2ML Soln injection Commonly known as: ZOFRAN Inject 2 mLs (4 mg total) into the vein every 6 (six) hours as needed for nausea.   polyethylene glycol 17 g packet Commonly known as: MIRALAX / GLYCOLAX Place 17 g into feeding tube daily as needed for moderate constipation.   polyethylene glycol 17 g packet Commonly known as: MIRALAX / GLYCOLAX Place 17 g into feeding tube daily. Start taking on: 07-04-2021   sodium chloride 0.9 % infusion Inject 250 mLs into the vein continuous.   sodium chloride 0.9 % infusion Inject 250 mLs into the vein continuous.            Disposition: ICU  Discharged Condition: Edward Jennings has met maximum benefit of inpatient care at [facility] and requires transfer to [facility] for continuous EEG.     Time spent on disposition:  50 Minutes.     Signed: Darel Hong, AGACNP-BC [facility] Prefer epic messenger for cross cover needs If after hours, please call E-link

## 2021-06-17 NOTE — Progress Notes (Signed)
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 Wilford Corner and Dr. Jayme Cloud.  Given poor clinical neuro exam with minimal brainstem function, along with MRI findings, there is consensus that there is negligible chance of any meaningful neurological recovery.  He is also very likely to progress to brain death over the next several hours to days.  Pt's family previously said they do not want him to suffer further, and wanted to proceed with terminal extubation and comfort measures.  Pt's family (2 brothers) are at bedside and request that care be withdrawn with Transition to COMFORT MEASURES at this time.  They report their mother is in agreement with plan, but that she is unable to stay/remain at bedside as she is undergoing significant emotional distress due to the situation.    PLAN: -DNR/DNI ~ Nurse may pronounce -Terminal Extubation with transition to FULL COMFORT MEASURES -Morphine gtt as needed with prn Boluses for discomfort/SOB/Air hunger -Prn Ativan for Anxiety -Prn Robinul for secretions -Will continue Keppra for now due to Myoclonus     .  Family understands the situation.  Family are satisfied with Plan of action and management. All questions answered  Additional CC time 30 mins   Harlon Ditty, AGACNP-BC [facility] Prefer epic messenger for cross cover needs If after hours, please call E-link

## 2021-06-17 NOTE — Progress Notes (Signed)
Pt was suctioned prior to extubation for no secretions. Per NP order, he was extubated and placed on comfort care.

## 2021-06-17 NOTE — Progress Notes (Signed)
   06/17/21 1030  Clinical Encounter Type  Visited With Patient and family together  Visit Type Follow-up;Spiritual support;Social support  Referral From Chaplain  Consult/Referral To Chaplain  Stress Factors  Family Stress Factors Health changes   Chaplain Horice Carrero did a follow up visit with patient who was unresponsive, his grand aunt, & cousin who was at beside. Chaplain ministered with intentional calm presence, reflective listening, hospitality, prayer, and compassionate presence. Chaplain gave space for expression of emotions, and storytelling. Family was very receptive to the visit, and expressed a deep faith in God, and his sovereignty. Chaplain told the family that a chaplain is available if needed (24/7), and will try to follow back up later, when they return.  Edward Jennings, M. Div.

## 2021-06-17 NOTE — Progress Notes (Signed)
SAME DAY PROGRESS NOTE  MRI Brain completed and reviewed personally. Diffuse anoxic brain injury affecting the cortex bilaterally, and deep gray matter structures along with generalized brain swelling with effacement of sulci.  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  Discussed findings with PCCM and patient's mother and brother at bedside.  Transfer for cEEG will not be of much use at this tme.  Looking at the poor exam with very minimal brainstem function, MRI showing diffuse anoxic brain injury involving the cortex and deep gray matter as well as 2 routine EEGs as listed in the consult as before, there is negligible chance of any neurologically meaningful recovery and he will more likely than not progress to brain death in the next few hours to days.  Family does not want to prolong his suffering and, although the mother was very emotional, I think she has resigned to the fact that he will not recover from this insult.  The family wants him terminally extubated.  I have relayed this plan to the critical care attending.  -- Milon Dikes, MD Neurologist [facility] Pager: [phone]  Additional 35 min of cc time.

## 2021-06-17 NOTE — Progress Notes (Signed)
NAME:  Edward Jennings, MRN:  [id number], DOB:  25-Nov-1995, LOS: 3 ADMISSION DATE:  05/29/2021, CONSULTATION DATE:  06/04/2021 REFERRING MD:  Dr. Cheri Fowler, CHIEF COMPLAINT:  Cardiac/Respiratory Arrest   Brief Pt Description / Synopsis:  25 year old morbidly obese male admitted with PEA cardiac arrest (30 minutes of CPR/ACLS before ROSC) due to respiratory arrest in the setting of severe asthma exacerbation.  Now with concerns for anoxic brain injury and myoclonus.  To transfer to [facility] for continuous EEG  History of Present Illness:  25 yo M presenting to [facility] ED via EMS from home in respiratory distress. Per EDP handoff the patient received Duo-neb and 125 mg of Solu-medrol with EMS, who placed the patient on CPAP for respiratory support. EMS reported on arrival to ED that the patient became severely agitated en route, throwing EMS personnel across the truck" ED course: Upon arrival to [facility] ED patient was unresponsive & in respiratory distress, then shortly after arrival lost pulses. Initial rhythm was asystole then PEA, he received CPR for 30 minutes including multiple rounds of epinephrine and initiation of a levophed drip. Patient was emergently intubated requiring mechanical ventilatory support. Initial vitals: 37.5, tachypneic 23, tachycardic 138, BP 90/48 (61) on levophed drip & SPO2 97% on 100% FiO2 Significant Labs: hyperkalemia- 6.8, AKI- BUN/Cr: 11/ 1.56, mild Transaminitis: AST/ALT- 122/82, elevated troponin: 118, BNP- 43.3, lactic acidosis- 7.5, leukocytosis- 35.1, severe respiratory acidosis - ABG: < 6.9/ >120/ 115/ unreadable. Labs/ Imaging personally reviewed CT head wo contrast 06/02/2021 > no acute intracranial abnormality. CT angio chest 06/16/2021 > negative for PE, patchy areas of infiltration/ atelectasis bilaterally with focal consolidation & volume loss in the lower lungs. CXR 06/20/2021> mild perihilar infiltrate   EKG Interpretation Date: 06/07/2021 EKG Time: 19:43 Rate:  155 Rhythm: Sinus Tachycardia QRS Axis: borderline LAD Intervals: prolonged Qtc- 535 ST/T Wave abnormalities: diffuse ST depression Narrative Interpretation: ST with signs of ischemia and prolonged Qtc   Patient lives with his older brother, [name], who I reached by phone with his other brother- [name]'s assistance.  [name] reported that he has heard the patient using his nebulizer machine in the middle of night frequently over the last 2 weeks. [name] denied anyone else in the household as being ill. He was not sure about signs/symptoms of illness, but reported that the patient was not acting differently and did not have any complaints recently.  Earlier this evening, 06/01/2021, [name] stated his brother called him on the phone from inside the house asking him to call the ambulance because he couldn't breathe. [name] reported that when he found the patient he was on his knees leaning on the bed for support and was so short of breath that he "stopped talking". [name] stated that the patient does NOT smoke cigarettes/use tobacco products, drinks socially only & denies any other recreational drug use.   PCCM consulted for admission to ICU.  Pertinent  Medical History  Asthma Obesity  Micro Data:  06/04/2021: SARS-CoV-2 and influenza PCR>> negative 06/02/2021: Strep pneumo urinary antigen>> negative 05/28/2021: Legionella urinary antigen>> negative 06/05/2021: Tracheal aspirate>> normal respiratory flora 06/15/2021: Respiratory viral panel>> negative 06/15/2021: Blood culture x2>> no growth to date  Antimicrobials:  Azithromycin 8/21 x 1 dose Ceftriaxone on 8/21>> 8/23 Doxycycline 8/21>> 8/23 Vancomycin 8/21>> 8/22 Zosyn 8/23>> 8/24 Unasyn 8/24>>  Significant Hospital Events: Including procedures, antibiotic start and stop dates in addition to other pertinent events   06/21/2021: Arrived in respiratory distress, in ED lost pulses- asystole then PEA, CPR for  30 minutes prior to ROSC- emergently  intubated and admitted to ICU with normothermia protocol 8/22 REMAINS CRITICALLY ILL ON VENT 8/22 EEG SHOWING BURST SUPPRESSION, SEVERE HYPOXIA 8/24: Minimal brainstem reflexes, development of Myoclonus with propofol held.  Plan for MRI Brain and transfer to [facility] for continuous EEG  Interim History / Subjective:  -Overnight developed myoclonus with propofol being held -Afebrile, hemodynamically stable, no vasopressors -On clinical exam with minimal brainstem reflexes (no cough/gag, no corneal reflex, pupils minimally responsive) -Does have respiratory drive (placed on pressure support and sustained respiratory rate 20-24) -Discussed with neurology, plan for MRI brain today and possible transfer to [facility] for continuous EEG  Objective   Blood pressure (!) 154/69, pulse (!) 115, temperature 99.3 F (37.4 C), temperature source Esophageal, resp. rate (!) 24, height _0  (1.753 m), weight (!) 158.2 kg, SpO2 100 %.    Vent Mode: PRVC FiO2 (%):  [35 %-40 %] 35 % Set Rate:  [26 bmp] 26 bmp Vt Set:  [500 mL] 500 mL PEEP:  [10 cmH20] 10 cmH20 Plateau Pressure:  [24 cmH20-26 cmH20] 24 cmH20   Intake/Output Summary (Last 24 hours) at 06/17/2021 1100 Last data filed at 06/17/2021 0900 Gross per 24 hour  Intake 960.33 ml  Output 3490 ml  Net -2529.67 ml   Filed Weights   05/27/2021 2002 06/15/21 0405 06/17/21 0453  Weight: (!) 157.3 kg (!) 154.4 kg (!) 158.2 kg    Examination: General: Critically ill-appearing male, laying in bed, unresponsive, no acute distress HENT: Atraumatic, normocephalic, neck supple, no JVD Lungs: Mechanical breath sounds bilaterally, overbreathing the ventilator, even, nonlabored Cardiovascular: Tachycardia, regular rhythm, S1-S2, no murmurs, rubs, gallops, 2+ distal pulses Abdomen: Obese, soft, nontender, nondistended, no guarding rebound tenderness, bowel sounds positive x4 Extremities: No deformities, trace bilateral lower extremity edema Neuro:  Unresponsive, minimal brainstem reflexes (no cough/gag reflex, no corneal reflex, pupils minimally responsive) GU: Foley catheter in place  Resolved Hospital Problem list   N/A  Assessment & Plan:   PEA Cardiac Arrest due to respiratory arrest in setting of asthma exacerbation Circulatory Shock>>resolved Elevated Troponin in setting of NSTEMI vs. Demand ischemia QTc Prolongation -Completed NORMOTHERMIA protocol -Continuous cardiac monitoring, serial EKG's -Maintain MAP >65 -IV fluids -Vasopressors as needed to maintain MAP goal~ weaned off pressors -HS Troponin peaked at 785 -Completed IV Heparin for ACS -Echocardiogram on 06/16/21 with LVEF 81-19%, normal diastolic parameters, RV systolic function normal -Consider Cardiology consult  Acute Hypoxic & Hypercapnic Respiratory Failure in the setting of Asthma Exacerbation -Full vent support, implement lung protective strategies -Wean FiO2 & PEEP as tolerated to maintain O2 sats >92% -Plateau pressures less than 30 cm H20 -Follow intermittent Chest X-ray & ABG as needed -Spontaneous Breathing Trials when respiratory parameters met and mental status permits -Implement VAP Bundle -Bronchodilators and Budesonide nebs -IV steroids  Acute Metabolic Encephalopathy Concern for severe Anoxic Brain Injury Myoclonic Jerking -Maintain a RASS goal of 0 to -1 -Fentanyl and Propofol as needed to maintain RASS goal -Avoid sedating medications as able -Daily wake up assessment -Neurology following, appreciate input -CT Head at admission negative for acute changes -Urine drug screen + for Benzodiazepines -EEG on 06/16/21 with continuous generalized background suppression with nonreactive EEG to tactile stimulation suggestive of profound diffuse encephalopathy-in the cardiac arrest pattern is suggestive of anoxic/hypoxic brain injury.  No seizures or epileptiform activity seen -IV Keppra increased 8/24 to 1500 mg BID (additional 1 g given  8/24) -Plan for MRI Brain today 8/24 -Neurology recommends transfer  to [facility] for continuous EEG  Suspected Aspiration Pneumonia -Monitor fever curve -Trend WBC's & Procalcitonin -Follow cultures as above -Continue empiric Unasyn pending cultures & sensitivities  Acute Kidney Injury>>improving -Monitor I&O's / urinary output -Follow BMP -Ensure adequate renal perfusion -Avoid nephrotoxic agents as able -Replace electrolytes as indicated    Best Practice (right click and "Reselect all SmartList Selections" daily)   Diet/type: tubefeeds DVT prophylaxis: LMWH GI prophylaxis: H2B Lines: N/A Foley:  Yes, and it is still needed Code Status:  full code Last date of multidisciplinary goals of care discussion [06/17/2021]  Updated pt's aunts at bedside 06/17/21.  All questions answered.  Labs   CBC: Recent Labs  Lab 06/23/2021 2029 06/15/21 0437 06/16/21 0447 06/17/21 0326  WBC 35.1* 20.1* 21.3* 19.2*  NEUTROABS 17.6*  --  17.6* 15.7*  HGB 15.5 14.4 12.6* 12.3*  HCT 48.2 44.1 38.7* 38.0*  MCV 93.8 92.8 91.9 93.4  PLT 403* 327 307 759    Basic Metabolic Panel: Recent Labs  Lab 06/15/2021 2029 06/01/2021 2231 06/15/21 0038 06/15/21 0133 06/15/21 0437 06/15/21 0638 06/15/21 1309 06/15/21 1513 06/16/21 0447 06/17/21 0326  NA 135  --   --  136  --   --  139  --  141 142  K 6.8*  --    < > 4.3  --  4.9 5.8* 4.8 4.4 4.2  CL 99  --   --  105  --   --  106  --  107 109  CO2 24  --   --  23  --   --  21*  --  26 27  GLUCOSE 273*  --   --  181*  --   --  138*  --  178* 168*  BUN 11  --   --  14  --   --  16  --  19 28*  CREATININE 1.56*  --   --  1.83*  --   --  1.66*  --  1.33* 1.13  CALCIUM 9.5  --   --  9.1  --   --  8.5*  --  8.0* 8.7*  MG  --  3.2*  --   --  2.7*  --   --   --  2.4 2.6*  PHOS  --  5.8*  --   --  2.1*  --   --  3.6 3.6 2.8   < > = values in this interval not displayed.   GFR: Estimated Creatinine Clearance: 146.8 mL/min (by C-G formula based on  SCr of 1.13 mg/dL). Recent Labs  Lab 06/07/2021 2029 06/06/2021 2231 06/15/21 0133 06/15/21 0437 06/15/21 1309 06/16/21 0447 06/16/21 1657 06/17/21 0326  PROCALCITON  --  4.96  --  37.45  --  21.09  --   --   WBC 35.1*  --   --  20.1*  --  21.3*  --  19.2*  LATICACIDVEN 7.5*  --  4.4* 4.7* 4.4*  --  1.7  --     Liver Function Tests: Recent Labs  Lab 06/02/2021 2029 06/15/21 0133 06/16/21 0447  AST 122* 113* 87*  ALT 82* 80* 62*  ALKPHOS 114 56 41  BILITOT 0.5 0.5 0.6  PROT 8.2* 7.3 6.5  ALBUMIN 4.1 3.8 3.3*   Recent Labs  Lab 06/16/21 0447  LIPASE 33  AMYLASE 173*   No results for input(s): AMMONIA in the last 168 hours.  ABG    Component Value Date/Time   PHART  7.33 (L) 06/16/2021 0500   PCO2ART 44 06/16/2021 0500   PO2ART 84 06/16/2021 0500   HCO3 23.2 06/16/2021 0500   ACIDBASEDEF 2.8 (H) 06/16/2021 0500   O2SAT 95.5 06/16/2021 0500     Coagulation Profile: Recent Labs  Lab 06/15/21 0038  INR 1.2    Cardiac Enzymes: No results for input(s): CKTOTAL, CKMB, CKMBINDEX, TROPONINI in the last 168 hours.  HbA1C: Hgb A1c MFr Bld  Date/Time Value Ref Range Status  06/15/2021 04:37 AM 6.6 (H) 4.8 - 5.6 % Final    Comment:    (NOTE) Pre diabetes:          5.7%-6.4%  Diabetes:              >6.4%  Glycemic control for   <7.0% adults with diabetes     CBG: Recent Labs  Lab 06/16/21 1614 06/16/21 1928 06/16/21 2318 06/17/21 0329 06/17/21 0736  GLUCAP 109* 119* 108* 144* 116*    Review of Systems:   Unable to assess due to unresponsiveness, intubation, sedation  Past Medical History:  He,  has a past medical history of Asthma and Obesity.   Surgical History:  No past surgical history on file.   Social History:   reports that he has never smoked. He has never used smokeless tobacco. He reports current alcohol use. He reports that he does not use drugs.   Family History:  His family history is not on file.   Allergies No Known Allergies    Home Medications  Prior to Admission medications   Medication Sig Start Date End Date Taking? Authorizing Provider  albuterol (PROVENTIL) (2.5 MG/3ML) 0.083% nebulizer solution Take 3 mLs (2.5 mg total) by nebulization every 6 (six) hours as needed for wheezing or shortness of breath. 12/02/19   Arta Silence, MD  fluticasone-salmeterol (ADVAIR DISKUS) 250-50 MCG/ACT AEPB Inhale 1 puff into the lungs in the morning and at bedtime. 02/25/21   Vladimir Crofts, MD  Fluticasone-Salmeterol (ADVAIR DISKUS) 250-50 MCG/DOSE AEPB Inhale 1 puff into the lungs 2 (two) times daily. 03/16/18 03/16/19  Earleen Newport, MD  montelukast (SINGULAIR) 10 MG tablet Take 1 tablet (10 mg total) by mouth at bedtime. 03/16/18   Earleen Newport, MD  predniSONE (DELTASONE) 50 MG tablet Take 1 tablet (50 mg total) by mouth daily. 02/25/21   Vladimir Crofts, MD  Spacer/Aero Chamber Mouthpiece MISC 1 Units by Does not apply route every 4 (four) hours as needed (wheezing). 07/21/17   Darel Hong, MD     Critical care time: 40 minutes     Darel Hong, AGACNP-BC [facility] Prefer epic messenger for cross cover needs If after hours, please call E-link

## 2021-06-17 NOTE — Plan of Care (Signed)

## 2021-06-17 NOTE — Progress Notes (Signed)
Patient Rt pupil fixed and dilated, different from previous nurse assessment. Patient brother Tonye Becket updated on change in condition.

## 2021-06-17 NOTE — Progress Notes (Signed)
PHARMACIST - PHYSICIAN COMMUNICATION  CONCERNING:  Enoxaparin (Lovenox) for DVT Prophylaxis    RECOMMENDATION:   Filed Weights   05/28/2021 2002 06/15/21 0405 06/17/21 0453  Weight: (!) 157.3 kg (346 lb 12.5 oz) (!) 154.4 kg (340 lb 6.2 oz) (!) 158.2 kg (348 lb 12.3 oz)    Body mass index is 51.5 kg/m.  Estimated Creatinine Clearance: 146.8 mL/min (by C-G formula based on SCr of 1.13 mg/dL).   Based on [facility] policy patient is candidate for enoxaparin 0.5mg /kg TBW SQ every 24 hours based on BMI being >30.  DESCRIPTION: Pharmacy has adjusted enoxaparin dose per [facility] policy.  Patient is now receiving enoxaparin 79.1 mg every 24 hours    Jaran Sainz Rodriguez-Guzman PharmD, BCPS 06/17/2021 11:04 AM

## 2021-06-17 NOTE — Progress Notes (Signed)
PHARMACY CONSULT NOTE - FOLLOW UP  Pharmacy Consult for Electrolyte Monitoring and Replacement   Recent Labs: Potassium (mmol/L)  Date Value  06/17/2021 4.2   Magnesium (mg/dL)  Date Value  47/06/6282 2.6 (H)   Calcium (mg/dL)  Date Value  66/29/4765 8.7 (L)   Albumin (g/dL)  Date Value  46/50/3546 3.3 (L)   Phosphorus (mg/dL)  Date Value  56/81/2751 2.8   Sodium (mmol/L)  Date Value  06/17/2021 142     Assessment: Patient with cardiac arrest and severe acidosis. Currently on vent and deep sedation. Mg slightyl above goal but no intrevention needed at this time. Corrected calcium at 9.2, all other electrolytes also within normal limits.   Goal of Therapy:  Electrolytes within normal limits  Plan:  - No additional intervention at this time - continue to monitor with AM labs  Delano Frate Rodriguez-Guzman PharmD, BCPS 06/17/2021 8:14 AM

## 2021-06-18 MED ORDER — FENTANYL 2500MCG IN NS 250ML (10MCG/ML) PREMIX INFUSION
INTRAVENOUS | Status: AC
  Filled 2021-06-18: qty 250

## 2021-06-20 LAB — CULTURE, BLOOD (ROUTINE X 2)
Culture: NO GROWTH
Culture: NO GROWTH
Special Requests: ADEQUATE

## 2021-06-25 NOTE — Death Summary Note (Addendum)
DEATH SUMMARY   Patient Details  Name: Edward Jennings MRN: [id number] DOB: 1995-12-12  Admission/Discharge Information   Admit Date:  07-12-21  Date of Death: Date of Death: 07/16/21  Time of Death: Time of Death: 0111  Length of Stay: 4  Referring Physician: [facility]   Reason(s) for Hospitalization  PEA cardiac arrest  due to respiratory arrest in the setting of severe asthma exacerbation  Diagnoses  Preliminary cause of death:  Secondary Diagnoses (including complications and co-morbidities):  Active Problems:   Cardiac arrest (HCC)   Acute respiratory failure with hypoxia and hypercapnia (HCC)   On mechanically assisted ventilation [facility])   Transaminitis   Brief Hospital Course (including significant findings, care, treatment, and services provided and events leading to death)  Edward Jennings is a 25 y.o. year old male who presented to the [facility] ED with PEA cardiac arrest (30 minutes of CPR/ACLS before ROSC) due to respiratory arrest in the setting of severe asthma exacerbation.  Per EDP handoff the patient received Duo-neb and 125 mg of Solu-medrol with EMS, who placed the patient on CPAP for respiratory support. EMS reported on arrival to ED that the patient became severely agitated en route, throwing EMS personnel across the truck"  ED course: Upon arrival to [facility] ED patient was unresponsive & in respiratory distress, then shortly after arrival lost pulses. Initial rhythm was asystole then PEA, he received CPR for 30 minutes including multiple rounds of epinephrine and initiation of a levophed drip. Patient was emergently intubated requiring mechanical ventilatory support. Initial vitals: 37.5, tachypneic 23, tachycardic 138, BP 90/48 (61) on levophed drip & SPO2 97% on 100% FiO2 Significant Labs: hyperkalemia- 6.8, AKI- BUN/Cr: 11/ 1.56, mild Transaminitis: AST/ALT- 122/82, elevated troponin: 118, BNP- 43.3, lactic acidosis- 7.5, leukocytosis- 35.1, severe respiratory  acidosis - ABG: < 6.9/ >120/ 115/ unreadable. Labs/ Imaging personally reviewed CT head wo contrast 07-12-2021 > no acute intracranial abnormality. CT angio chest 07-12-21 > negative for PE, patchy areas of infiltration/ atelectasis bilaterally with focal consolidation & volume loss in the lower lungs. CXR 07-12-21> mild perihilar infiltrate EKG: ST with signs of ischemia and prolonged Qtc PCCM was consulted for admission to ICU.  Hospital Course: During the course of his admission to the ICU, patient remained encephalopathic with poor neurological exam. He was noted with myoclonic jerking movements concerning for seizures vs severe anoxic brain injury. EEG was obtained on 06/16/21 which showed continuous generalized background suppression with nonreactive EEG to tactile stimulation suggestive of profound diffuse encephalopathy-in the cardiac arrest pattern is suggestive of anoxic/hypoxic brain injury.  No seizures or epileptiform activity seen. Patient was started on IV Keppra increased 8/24 to 1500 mg BID (additional 1 g given 8/24) and Neurology consulted with with plan for MRI Brain 8/24 and transfer to [facility] for continuous EEG monitoring. MRI Brain resulted prior to transfer showing  ("diffuse anoxic brain injury with Cerebral edema and brain swelling leading to cerebellar tonsillar herniation through the foramen magnum of 4 mm"). Findings was discussed with patient's family by Neurology.  Given poor clinical neuro exam with minimal brainstem function, along with MRI findings, there was consensus that there is negligible chance of any meaningful neurological recovery.  He was also very likely to progress to brain death over the next several hours to days. Pt's family previously said they do not want him to suffer further, and wanted to proceed with terminal extubation and comfort measures. Pt's family (2 brothers) who were at bedside requested  that care be withdrawn with Transition to COMFORT MEASURES.   They report their mother is in agreement with plan, but that she is unable to stay/remain at bedside as she is undergoing significant emotional distress due to the situation. Patient was terminally extubated at 4:50 on 06/17/2021 and transitioned to full comfort care. Patient passed away peacefully on Jun 28, 2021 at 0111. Family was notified of time death.  Pertinent Labs and Studies  Significant Diagnostic Studies DG Abd 1 View  Result Date: 06/15/2021 CLINICAL DATA:  Obesity, vomiting EXAM: ABDOMEN - 1 VIEW COMPARISON:  None. FINDINGS: 2 supine frontal views of the abdomen and pelvis are obtained. Enteric catheter tip projects over the gastric body. Bowel gas pattern is unremarkable without obstruction or ileus. There are no masses or abnormal calcifications. No acute bony abnormalities. IMPRESSION: 1. Unremarkable bowel gas pattern. Electronically Signed   By: Sharlet Salina M.D.   On: 06/15/2021 17:28   CT HEAD WO CONTRAST ( )  Result Date: 06/19/2021 CLINICAL DATA:  Mental status change after cardiac arrest. EXAM: CT HEAD WITHOUT CONTRAST TECHNIQUE: Contiguous axial images were obtained from the base of the skull through the vertex without intravenous contrast. COMPARISON:  None. FINDINGS: Brain: No evidence of acute infarction, hemorrhage, hydrocephalus, extra-axial collection or mass lesion/mass effect. Vascular: No hyperdense vessel or unexpected calcification. Skull: Normal. Negative for fracture or focal lesion. Sinuses/Orbits: Air-fluid levels throughout the paranasal sinuses, likely related to intubation. Mastoid air cells are clear. Other: Oral enteric and tracheal tubes are seen. IMPRESSION: No acute intracranial abnormalities are identified. Electronically Signed   By: Burman Nieves M.D.   On: 06/17/2021 22:32   CT Angio Chest Pulmonary Embolism (PE) W or WO Contrast  Result Date: 06/20/2021 CLINICAL DATA:  Pulmonary embolus suspected with high probability. Mental status change of  unknown cause. EXAM: CT ANGIOGRAPHY CHEST WITH CONTRAST TECHNIQUE: Multidetector CT imaging of the chest was performed using the standard protocol during bolus administration of intravenous contrast. Multiplanar CT image reconstructions and MIPs were obtained to evaluate the vascular anatomy. CONTRAST:  OMNIPAQUE IOHEXOL 350 MG/ML SOLN COMPARISON:  Chest radiograph 06/13/2021 FINDINGS: Cardiovascular: Examination is technically limited due to motion and streak artifact and limited contrast bolus. Heterogeneous nonsegmental low-attenuation areas are demonstrated consistent with artifact. No definite evidence of any large central pulmonary embolus. Small peripheral emboli could be obscured. Normal heart size. No pericardial effusions. Normal caliber thoracic aorta. Mediastinum/Nodes: Lymph nodes are demonstrated in the mediastinum without pathologic enlargement, likely reactive. Endotracheal and enteric tubes are present. Esophagus is decompressed. Lungs/Pleura: Motion artifact limits examination. There is focal consolidation with volume loss in the lower lungs bilaterally with some peripheral patchy infiltrates in the upper lungs. This could indicate areas of atelectasis or consolidation. No pleural effusions. No pneumothorax. Upper Abdomen: No acute abnormality. Musculoskeletal: No chest wall abnormality. No acute or significant osseous findings. Apparent deformity of the sternum is likely artifact from motion. Review of the MIP images confirms the above findings. IMPRESSION: 1. Examination is technically limited as described. No definite evidence of any large central pulmonary embolus. 2. Patchy areas of infiltration or atelectasis demonstrated bilaterally. Focal consolidation and volume loss in the lower lungs. Changes could represent atelectasis or multifocal pneumonia. Electronically Signed   By: Burman Nieves M.D.   On: 06/05/2021 22:45   MR BRAIN WO CONTRAST  Result Date: 06/17/2021 CLINICAL DATA:   Anoxic brain injury. Respiratory arrest with 30 minutes of cardiopulmonary resuscitation. EXAM: MRI HEAD WITHOUT CONTRAST TECHNIQUE: Multiplanar, multiecho pulse sequences of  the brain and surrounding structures were obtained without intravenous contrast. COMPARISON:  Head CT 06/22/2021. FINDINGS: Brain: There is generalized brain swelling with effacement of the sulci. There is diffuse restricted diffusion affecting the cortical brain consistent with diffuse hypoxic ischemic injury. Basal ganglia and thalamic involvement as well. Brain swelling leads to tonsillar extension through the foramen magnum of about 4 mm. Ventricles are somewhat diminutive. No hemorrhage or extra-axial collection. Vascular: Major vessels at the base of the brain show flow. Skull and upper cervical spine: Negative Sinuses/Orbits: Mucosal inflammatory changes in fluid throughout the paranasal sinuses. Orbits negative. 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 Generalized brain swelling with effacement of the sulci. Brain swelling leads to cerebellar tonsillar extension through the foramen magnum of 4 mm. Electronically Signed   By: Paulina Fusi M.D.   On: 06/17/2021 12:04   DG Chest Port 1 View  Result Date: 06/16/2021 CLINICAL DATA:  Intubation EXAM: PORTABLE CHEST 1 VIEW COMPARISON:  06/15/2021 FINDINGS: Endotracheal tube terminates approximately 5.3 cm above the carina. Enteric tube courses below the diaphragm with distal tip beyond the inferior margin of the film. Stable positioning of left IJ central venous catheter. Multiple overlying monitoring leads. Heart size within normal limits. Improving aeration of the right lung. No pleural effusion. No evidence of a pneumothorax. IMPRESSION: 1. Stable support apparatus. 2. Improving aeration of the right lung. Electronically Signed   By: Duanne Guess D.O.   On: 06/16/2021 11:12   DG Chest Port 1  View  Result Date: 06/15/2021 CLINICAL DATA:  Central line and OG placement EXAM: PORTABLE CHEST 1 VIEW COMPARISON:  06/08/2021 FINDINGS: Endotracheal tube with tip measuring 4.1 cm above the carina. Enteric tube tip is in the left upper quadrant consistent with location in the upper stomach. A left central venous catheter has been placed. Tip is directed laterally in the right mediastinum, likely location would be at the junction of the brachiocephalic vein and superior vena cava. No pneumothorax. Shallow inspiration. Linear atelectasis or consolidation in the right mid lung and left upper lung. No pleural effusions. IMPRESSION: Left central venous catheter likely positioned at the junction of the brachiocephalic vein and superior vena cava. Areas of linear atelectasis or infiltration in the right mid lung and left upper lung. Electronically Signed   By: Burman Nieves M.D.   On: 06/15/2021 00:30   DG Chest Portable 1 View  Result Date: 06/06/2021 CLINICAL DATA:  Status post intubation. EXAM: PORTABLE CHEST 1 VIEW COMPARISON:  02/25/2021 FINDINGS: Endotracheal tube is in place, tip approximately 3.2 centimeters above the carina. The nasogastric tube has been placed, tip difficult to visualized but at least to the level of the stomach. Heart is mildly enlarged. There is mild perihilar infiltrate consistent with edema or infectious process. IMPRESSION: Interval intubation. Electronically Signed   By: Norva Pavlov M.D.   On: 06/06/2021 20:10   EEG adult  Result Date: 06/16/2021 Edward Quest, MD     06/16/2021 12:25 PM Patient Name: Edward Jennings MRN: [id number] Epilepsy Attending: Charlsie Jennings Referring Physician/Provider: Dr Milon Dikes Date: 06/16/2021 Duration: 28.37mins  Patient history: 25 year old male presented to the hospital with respiratory distress, eventually lost pulses, underwent CPR for 30 minutes following which she was emergently intubated and had intact brainstem reflexes at the  time and very soon after started exhibiting myoclonic jerking. EEG to evaluate for seizure  Level of alertness: comatose  AEDs during EEG study: LEV,  propofol  Technical aspects: This EEG study was done with scalp electrodes positioned according to the 10-20 International system of electrode placement. Electrical activity was acquired at a sampling rate of 500Hz  and reviewed with a high frequency filter of 70Hz  and a low frequency filter of 1Hz . EEG data were recorded continuously and digitally stored.  Description: EEG showed continuous generalized background suppression. EEG was not reactive to tactile stimulation.  Hyperventilation and photic stimulation were not performed.    ABNORMALITY -Background suppression, generalized   IMPRESSION: This study is suggestive of profound diffuse encephalopathy. 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  No seizures or epileptiform discharges were seen during this time.    EEG adult  Result Date: 06/15/2021 , MD     06/15/2021  4:04 PM Patient Name: Edward Jennings MRN: Edward Jennings Epilepsy Attending: 06/17/2021 Referring Physician/Provider: Dr Earmon Phoenix Date: 06/15/2021 Duration: 22.27 mins Patient history: 25 year old male presented to the hospital with respiratory distress, eventually lost pulses, underwent CPR for 30 minutes following which she was emergently intubated and had intact brainstem reflexes at the time and very soon after started exhibiting myoclonic jerking. EEG to evaluate for seizure Level of alertness: comatose AEDs during EEG study: LEV, propofol Technical aspects: This EEG study was done with scalp electrodes positioned according to the 10-20 International system of electrode placement. Electrical activity was acquired at a sampling rate of 500Hz  and reviewed with a high frequency filter of 70Hz  and a low frequency filter of 1Hz . EEG data were recorded  continuously and digitally stored. Description: EEG showed burst suppression pattern with highly epileptiform bursts lasting 1-4 seconds and generalized suppression lasting 2-4 seconds. Hyperventilation and photic stimulation were not performed.   ABNORMALITY - Burst suppression with highly epileptiform bursts, generalized IMPRESSION: This study showed evidence of epileptogenicity with generalized onset and high potential for seizure recurrence. There is also profound diffuse encephalopathy. 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 Dr Milon Dikes was notified.  06/17/2021   ECHOCARDIOGRAM COMPLETE  Result Date: 06/16/2021    ECHOCARDIOGRAM REPORT   Patient Name:   Edward Jennings Date of Exam: 06/16/2021 Medical Rec #:    Height:       69.0 in Accession #:    [place]  Weight:       340.4 lb Date of Birth:  [birth year]   BSA:          2.590 m Patient Age:    25 years    BP:           123/61 mmHg Patient Gender: M           HR:           116 bpm. Exam Location:  [facility] Procedure: 2D Echo, Color Doppler, Cardiac Doppler and Intracardiac            Opacification Agent Indications:     I46.9 Cardiac arrest  History:         Patient has no prior history of Echocardiogram examinations.                  Asthma.  Sonographer:     06/17/2021 Referring Phys:  Earmon Phoenix BRITTON L [facility] Diagnosing Phys: 06/08/2021 MD  Sonographer Comments: Echo performed with patient supine and on artificial respirator and Technically difficult study due to poor echo windows.  Image acquisition challenging due to patient body habitus. IMPRESSIONS  1. Left ventricular ejection fraction, by estimation, is 50 to 55%. The left ventricle has low normal function. The left ventricle has no regional wall motion abnormalities. Left ventricular diastolic parameters were normal.  2. Right ventricular systolic function is normal. The right ventricular size is normal.  3. The mitral  valve is normal in structure. Trivial mitral valve regurgitation.  4. The aortic valve is normal in structure. Aortic valve regurgitation is mild. FINDINGS  Left Ventricle: Left ventricular ejection fraction, by estimation, is 50 to 55%. The left ventricle has low normal function. The left ventricle has no regional wall motion abnormalities. Definity contrast agent was given IV to delineate the left ventricular endocardial borders. The left ventricular internal cavity size was normal in size. There is no left ventricular hypertrophy. Left ventricular diastolic parameters were normal. Right Ventricle: The right ventricular size is normal. No increase in right ventricular wall thickness. Right ventricular systolic function is normal. Left Atrium: Left atrial size was normal in size. Right Atrium: Right atrial size was normal in size. Pericardium: There is no evidence of pericardial effusion. Mitral Valve: The mitral valve is normal in structure. Trivial mitral valve regurgitation. MV peak gradient, 6.0 mmHg. The mean mitral valve gradient is 4.0 mmHg. Tricuspid Valve: The tricuspid valve is normal in structure. Tricuspid valve regurgitation is trivial. Aortic Valve: The aortic valve is normal in structure. Aortic valve regurgitation is mild. Aortic valve mean gradient measures 7.0 mmHg. Aortic valve peak gradient measures 12.4 mmHg. Aortic valve area, by VTI measures 2.58 cm. Pulmonic Valve: The pulmonic valve was normal in structure. Pulmonic valve regurgitation is not visualized. Aorta: The ascending aorta was not well visualized. IAS/Shunts: No atrial level shunt detected by color flow Doppler.  LEFT VENTRICLE PLAX 2D LVIDd:         4.70 cm  Diastology LVIDs:         3.40 cm  LV e' medial:    12.40 cm/s LV PW:         0.90 cm  LV E/e' medial:  9.0 LV IVS:        0.80 cm  LV e' lateral:   20.80 cm/s LVOT diam:     2.20 cm  LV E/e' lateral: 5.3 LV SV:         66 LV SV Index:   25 LVOT Area:     3.80 cm  LEFT ATRIUM            Index LA diam:      3.40 cm 1.31 cm/m LA Vol (A4C): 27.2 ml 10.50 ml/m  AORTIC VALVE                    PULMONIC VALVE AV Area (Vmax):    2.89 cm     PV Vmax:       1.28 m/s AV Area (Vmean):   2.65 cm     PV Vmean:      93.300 cm/s AV Area (VTI):     2.58 cm     PV VTI:        0.213 m AV Vmax:           176.00 cm/s  PV Peak grad:  6.6 mmHg AV Vmean:          127.000 cm/s PV Mean grad:  4.0 mmHg AV VTI:            0.255 m AV Peak Grad:  12.4 mmHg AV Mean Grad:      7.0 mmHg LVOT Vmax:         134.00 cm/s LVOT Vmean:        88.600 cm/s LVOT VTI:          0.173 m LVOT/AV VTI ratio: 0.68  AORTA Ao Root diam: 2.90 cm MITRAL VALVE MV Area (PHT): 9.60 cm     SHUNTS MV Area VTI:   2.99 cm     Systemic VTI:  0.17 m MV Peak grad:  6.0 mmHg     Systemic Diam: 2.20 cm MV Mean grad:  4.0 mmHg MV Vmax:       1.22 m/s MV Vmean:      98.7 cm/s MV Decel Time: 79 msec MV E velocity: 111.00 cm/s MV A velocity: 101.00 cm/s MV E/A ratio:  1.10 Dwayne D Callwood MD Electronically signed by Alwyn Pea MD Signature Date/Time: 06/16/2021/1:47:23 PM    Final     Microbiology Recent Results (from the past 240 hour(s))  Resp Panel by RT-PCR (Flu A&B, Covid) Nasopharyngeal Swab     Status: None   Collection Time: 02-Jul-2021  8:54 PM   Specimen: Nasopharyngeal Swab; Nasopharyngeal(NP) swabs in vial transport medium  Result Value Ref Range Status   SARS Coronavirus 2 by RT PCR NEGATIVE NEGATIVE Final    Comment: (NOTE) SARS-CoV-2 target nucleic acids are NOT DETECTED.  The SARS-CoV-2 RNA is generally detectable in upper respiratory specimens during the acute phase of infection. The lowest concentration of SARS-CoV-2 viral copies this assay can detect is 138 copies/mL. A negative result does not preclude SARS-Cov-2 infection and should not be used as the sole basis for treatment or other patient management decisions. A negative result may occur with  improper specimen collection/handling, submission of  specimen other than nasopharyngeal swab, presence of viral mutation(s) within the areas targeted by this assay, and inadequate number of viral copies(<138 copies/mL). A negative result must be combined with clinical observations, patient history, and epidemiological information. The expected result is Negative.  Fact Sheet for Patients:  BloggerCourse.com  Fact Sheet for Healthcare Providers:  SeriousBroker.it  This test is no t yet approved or cleared by the [place] FDA and  has been authorized for detection and/or diagnosis of SARS-CoV-2 by FDA under an Emergency Use Authorization (EUA). This EUA will remain  in effect (meaning this test can be used) for the duration of the COVID-19 declaration under Section 564(b)(1) of the Act, 21 U.S.C.section 360bbb-3(b)(1), unless the authorization is terminated  or revoked sooner.       Influenza A by PCR NEGATIVE NEGATIVE Final   Influenza B by PCR NEGATIVE NEGATIVE Final    Comment: (NOTE) The Xpert Xpress SARS-CoV-2/FLU/RSV plus assay is intended as an aid in the diagnosis of influenza from Nasopharyngeal swab specimens and should not be used as a sole basis for treatment. Nasal washings and aspirates are unacceptable for Xpert Xpress SARS-CoV-2/FLU/RSV testing.  Fact Sheet for Patients: BloggerCourse.com  Fact Sheet for Healthcare Providers: SeriousBroker.it  This test is not yet approved or cleared by the [place] FDA and has been authorized for detection and/or diagnosis of SARS-CoV-2 by FDA under an Emergency Use Authorization (EUA). This EUA will remain in effect (meaning this test can be used) for the duration of the COVID-19 declaration under Section 564(b)(1) of the Act, 21 U.S.C. section 360bbb-3(b)(1), unless the authorization is terminated or revoked.  Performed at [facility], [address] [place],  [place] [address]   Culture, Respiratory w Gram Stain     Status: None   Collection Time: 06/10/2021  8:56 PM   Specimen: Tracheal Aspirate; Respiratory  Result Value Ref Range Status   Specimen Description   Final    TRACHEAL ASPIRATE Performed at [facility], [address], [place], [place] [address]    Special Requests   Final    NONE Performed at [facility], [address] [place], [place] [address]    Gram Stain   Final    FEW WBC PRESENT, PREDOMINANTLY PMN RARE SQUAMOUS EPITHELIAL CELLS PRESENT FEW GRAM POSITIVE RODS RARE GRAM POSITIVE COCCI RARE GRAM NEGATIVE RODS    Culture   Final    FEW Normal respiratory flora-no Staph aureus or Pseudomonas seen Performed at [facility], [address] [place], [place] [address]    Report Status 06/17/2021 FINAL  Final  Respiratory (~20 pathogens) panel by PCR     Status: None   Collection Time: 06/15/21  1:33 AM   Specimen: Nasopharyngeal Swab; Respiratory  Result Value Ref Range Status   Adenovirus NOT DETECTED NOT DETECTED Final   Coronavirus 229E NOT DETECTED NOT DETECTED Final    Comment: (NOTE) The Coronavirus on the Respiratory Panel, DOES NOT test for the novel  Coronavirus (2019 nCoV)    Coronavirus HKU1 NOT DETECTED NOT DETECTED Final   Coronavirus NL63 NOT DETECTED NOT DETECTED Final   Coronavirus OC43 NOT DETECTED NOT DETECTED Final   Metapneumovirus NOT DETECTED NOT DETECTED Final   Rhinovirus / Enterovirus NOT DETECTED NOT DETECTED Final   Influenza A NOT DETECTED NOT DETECTED Final   Influenza B NOT DETECTED NOT DETECTED Final   Parainfluenza Virus 1 NOT DETECTED NOT DETECTED Final   Parainfluenza Virus 2 NOT DETECTED NOT DETECTED Final   Parainfluenza Virus 3 NOT DETECTED NOT DETECTED Final   Parainfluenza Virus 4 NOT DETECTED NOT DETECTED Final   Respiratory Syncytial Virus NOT DETECTED NOT DETECTED Final   Bordetella pertussis NOT DETECTED NOT DETECTED Final    Bordetella Parapertussis NOT DETECTED NOT DETECTED Final   Chlamydophila pneumoniae NOT DETECTED NOT DETECTED Final   Mycoplasma pneumoniae NOT DETECTED NOT DETECTED Final    Comment: Performed at [facility], [address] [place], [place] [address]  CULTURE, BLOOD (ROUTINE X 2) w Reflex to ID Panel     Status: None (Preliminary result)   Collection Time: 06/15/21  1:09 PM   Specimen: BLOOD  Result Value Ref Range Status   Specimen Description BLOOD BLOOD LEFT HAND  Final   Special Requests   Final    BOTTLES DRAWN AEROBIC AND ANAEROBIC Blood Culture adequate volume   Culture   Final    NO GROWTH 2 DAYS Performed at [facility], [address], [place], [place] [address]    Report Status PENDING  Incomplete  MRSA Next Gen by PCR, Nasal     Status: None   Collection Time: 06/15/21  5:02 PM   Specimen: Nasal Mucosa; Nasal Swab  Result Value Ref Range Status   MRSA by PCR Next Gen NOT DETECTED NOT DETECTED Final    Comment: (NOTE) The GeneXpert MRSA Assay (FDA approved for NASAL specimens only), is one component of a comprehensive MRSA colonization surveillance program. It is not intended to diagnose MRSA infection nor to guide or monitor treatment for MRSA infections. Test performance is not FDA approved in patients less than 46 years old. Performed at [facility], [address] [place], [place] [address]   Culture, blood (Routine X 2) w Reflex to ID Panel     Status: None (Preliminary result)   Collection Time: 06/15/21  6:28 PM   Specimen: BLOOD  Result Value Ref Range Status   Specimen Description BLOOD BLOOD LEFT HAND  Final   Special Requests   Final    BOTTLES DRAWN AEROBIC AND ANAEROBIC Blood Culture results may not be optimal due to an inadequate volume of blood received in culture bottles   Culture   Final    NO GROWTH 2 DAYS Performed at [facility], [address] [place], [place] [address]    Report Status PENDING   Incomplete    Lab Basic Metabolic Panel: Recent Labs  Lab 06/03/2021 2029 06/08/2021 2231 06/15/21 0038 06/15/21 0133 06/15/21 0437 06/15/21 0638 06/15/21 1309 06/15/21 1513 06/16/21 0447 06/17/21 0326  NA 135  --   --  136  --   --  139  --  141 142  K 6.8*  --    < > 4.3  --  4.9 5.8* 4.8 4.4 4.2  CL 99  --   --  105  --   --  106  --  107 109  CO2 24  --   --  23  --   --  21*  --  26 27  GLUCOSE 273*  --   --  181*  --   --  138*  --  178* 168*  BUN 11  --   --  14  --   --  16  --  19 28*  CREATININE 1.56*  --   --  1.83*  --   --  1.66*  --  1.33* 1.13  CALCIUM 9.5  --   --  9.1  --   --  8.5*  --  8.0* 8.7*  MG  --  3.2*  --   --  2.7*  --   --   --  2.4 2.6*  PHOS  --  5.8*  --   --  2.1*  --   --  3.6 3.6 2.8   < > = values in this interval not displayed.   Liver Function Tests: Recent Labs  Lab 06/08/2021 2029 06/15/21 0133 06/16/21 0447  AST 122* 113* 87*  ALT 82* 80* 62*  ALKPHOS 114 56 41  BILITOT 0.5 0.5 0.6  PROT 8.2* 7.3 6.5  ALBUMIN 4.1 3.8 3.3*   Recent Labs  Lab 06/16/21 0447  LIPASE 33  AMYLASE 173*   No results for input(s): AMMONIA in the last 168 hours. CBC: Recent Labs  Lab 06/12/2021 2029 06/15/21 0437 06/16/21 0447 06/17/21 0326  WBC 35.1* 20.1* 21.3* 19.2*  NEUTROABS 17.6*  --  17.6* 15.7*  HGB 15.5 14.4 12.6* 12.3*  HCT 48.2 44.1 38.7* 38.0*  MCV 93.8 92.8 91.9 93.4  PLT 403* 327 307 290   Cardiac Enzymes: No results for input(s): CKTOTAL, CKMB, CKMBINDEX, TROPONINI in the last 168 hours. Sepsis Labs: Recent Labs  Lab 05/30/2021 2029 06/17/2021 2231 06/15/21 0133 06/15/21 0437 06/15/21 1309 06/16/21 0447 06/16/21 1657 06/17/21 0326  PROCALCITON  --  4.96  --  37.45  --  21.09  --   --   WBC 35.1*  --   --  20.1*  --  21.3*  --  19.2*  LATICACIDVEN 7.5*  --  4.4* 4.7* 4.4*  --  1.7  --  Procedures/Operations     Critical Time 25 minutes   Webb Silversmith, DNP, CCRN, FNP-C, AGACNP-BC Acute Care Nurse Practitioner   [facility] Pager: [phone] [place] [facility]

## 2021-06-25 NOTE — Progress Notes (Signed)
Patient RR down to 4/min sustained. Attempted to call patient brother and mother. No answer. Unable to leave message for brother, Pheonix Clinkscale.

## 2021-06-25 NOTE — Progress Notes (Signed)
Voicemail left for patient mother, no answer. Contact made with patient brother Rawlin Reaume.

## 2021-06-25 DEATH — deceased

## 2021-08-03 LAB — BLOOD GAS, ARTERIAL
FIO2: 1
MECHVT: 500 mL
Mechanical Rate: 20
PEEP: 10 cmH2O
Patient temperature: 37
pCO2 arterial: >120 mmHg (ref 32.0–48.0)
pH, Arterial: <6.9 — CL (ref 7.350–7.450)
pO2, Arterial: 115 mmHg — ABNORMAL HIGH (ref 83.0–108.0)
